# Patient Record
Sex: Female | Born: 1945
Health system: Southern US, Community
[De-identification: ages and names within clinical notes are randomized; demographics above are authoritative.]

## PROBLEM LIST (undated history)

## (undated) DIAGNOSIS — I8390 Asymptomatic varicose veins of unspecified lower extremity: Secondary | ICD-10-CM

## (undated) DIAGNOSIS — M7989 Other specified soft tissue disorders: Secondary | ICD-10-CM

## (undated) DIAGNOSIS — M79606 Pain in leg, unspecified: Secondary | ICD-10-CM

## (undated) HISTORY — DX: Pain in leg, unspecified: M79.606

## (undated) HISTORY — DX: Asymptomatic varicose veins of unspecified lower extremity: I83.90

## (undated) HISTORY — DX: Other specified soft tissue disorders: M79.89

## (undated) HISTORY — PX: CHOLECYSTECTOMY: SHX55

---

## 1993-09-01 HISTORY — PX: ARTHROSCOPY KNEE W/ DRILLING: SUR92

## 1997-10-19 ENCOUNTER — Ambulatory Visit (HOSPITAL_COMMUNITY): Admission: RE | Admit: 1997-10-19 | Discharge: 1997-10-19 | Payer: Self-pay | Admitting: Obstetrics and Gynecology

## 1997-11-22 ENCOUNTER — Other Ambulatory Visit: Admission: RE | Admit: 1997-11-22 | Discharge: 1997-11-22 | Payer: Self-pay | Admitting: Obstetrics and Gynecology

## 1998-11-07 ENCOUNTER — Ambulatory Visit (HOSPITAL_COMMUNITY): Admission: RE | Admit: 1998-11-07 | Discharge: 1998-11-07 | Payer: Self-pay | Admitting: Obstetrics and Gynecology

## 1998-11-07 ENCOUNTER — Encounter: Payer: Self-pay | Admitting: Obstetrics and Gynecology

## 1998-12-06 ENCOUNTER — Other Ambulatory Visit: Admission: RE | Admit: 1998-12-06 | Discharge: 1998-12-06 | Payer: Self-pay | Admitting: Obstetrics and Gynecology

## 1999-11-12 ENCOUNTER — Encounter: Payer: Self-pay | Admitting: Obstetrics and Gynecology

## 1999-11-12 ENCOUNTER — Ambulatory Visit (HOSPITAL_COMMUNITY): Admission: RE | Admit: 1999-11-12 | Discharge: 1999-11-12 | Payer: Self-pay | Admitting: Obstetrics and Gynecology

## 1999-11-19 ENCOUNTER — Encounter: Payer: Self-pay | Admitting: Obstetrics and Gynecology

## 1999-11-19 ENCOUNTER — Ambulatory Visit (HOSPITAL_COMMUNITY): Admission: RE | Admit: 1999-11-19 | Discharge: 1999-11-19 | Payer: Self-pay | Admitting: Obstetrics and Gynecology

## 1999-12-23 ENCOUNTER — Other Ambulatory Visit: Admission: RE | Admit: 1999-12-23 | Discharge: 1999-12-23 | Payer: Self-pay | Admitting: Obstetrics and Gynecology

## 2000-11-13 ENCOUNTER — Ambulatory Visit (HOSPITAL_COMMUNITY): Admission: RE | Admit: 2000-11-13 | Discharge: 2000-11-13 | Payer: Self-pay | Admitting: Obstetrics and Gynecology

## 2000-11-13 ENCOUNTER — Encounter: Payer: Self-pay | Admitting: Obstetrics and Gynecology

## 2000-12-22 ENCOUNTER — Other Ambulatory Visit: Admission: RE | Admit: 2000-12-22 | Discharge: 2000-12-22 | Payer: Self-pay | Admitting: Obstetrics and Gynecology

## 2001-12-20 ENCOUNTER — Encounter: Payer: Self-pay | Admitting: Obstetrics and Gynecology

## 2001-12-20 ENCOUNTER — Ambulatory Visit (HOSPITAL_COMMUNITY): Admission: RE | Admit: 2001-12-20 | Discharge: 2001-12-20 | Payer: Self-pay | Admitting: Obstetrics and Gynecology

## 2002-01-26 ENCOUNTER — Other Ambulatory Visit: Admission: RE | Admit: 2002-01-26 | Discharge: 2002-01-26 | Payer: Self-pay | Admitting: Obstetrics and Gynecology

## 2002-05-27 ENCOUNTER — Ambulatory Visit (HOSPITAL_COMMUNITY): Admission: RE | Admit: 2002-05-27 | Discharge: 2002-05-27 | Payer: Self-pay | Admitting: Obstetrics and Gynecology

## 2002-07-07 ENCOUNTER — Encounter (INDEPENDENT_AMBULATORY_CARE_PROVIDER_SITE_OTHER): Payer: Self-pay

## 2002-07-07 ENCOUNTER — Ambulatory Visit (HOSPITAL_COMMUNITY): Admission: RE | Admit: 2002-07-07 | Discharge: 2002-07-07 | Payer: Self-pay | Admitting: Obstetrics and Gynecology

## 2002-10-28 ENCOUNTER — Encounter (INDEPENDENT_AMBULATORY_CARE_PROVIDER_SITE_OTHER): Payer: Self-pay

## 2002-10-28 ENCOUNTER — Ambulatory Visit (HOSPITAL_COMMUNITY): Admission: RE | Admit: 2002-10-28 | Discharge: 2002-10-28 | Payer: Self-pay | Admitting: Obstetrics and Gynecology

## 2003-03-01 ENCOUNTER — Encounter: Payer: Self-pay | Admitting: Obstetrics and Gynecology

## 2003-03-01 ENCOUNTER — Ambulatory Visit (HOSPITAL_COMMUNITY): Admission: RE | Admit: 2003-03-01 | Discharge: 2003-03-01 | Payer: Self-pay | Admitting: Obstetrics and Gynecology

## 2003-03-22 ENCOUNTER — Other Ambulatory Visit: Admission: RE | Admit: 2003-03-22 | Discharge: 2003-03-22 | Payer: Self-pay | Admitting: Obstetrics and Gynecology

## 2003-10-27 ENCOUNTER — Encounter: Admission: RE | Admit: 2003-10-27 | Discharge: 2003-10-27 | Payer: Self-pay | Admitting: Internal Medicine

## 2003-11-10 ENCOUNTER — Encounter: Admission: RE | Admit: 2003-11-10 | Discharge: 2003-11-10 | Payer: Self-pay | Admitting: Internal Medicine

## 2004-05-21 ENCOUNTER — Other Ambulatory Visit: Admission: RE | Admit: 2004-05-21 | Discharge: 2004-05-21 | Payer: Self-pay | Admitting: Obstetrics and Gynecology

## 2004-10-25 ENCOUNTER — Ambulatory Visit (HOSPITAL_COMMUNITY): Admission: RE | Admit: 2004-10-25 | Discharge: 2004-10-25 | Payer: Self-pay | Admitting: Gastroenterology

## 2004-12-19 ENCOUNTER — Encounter: Admission: RE | Admit: 2004-12-19 | Discharge: 2004-12-19 | Payer: Self-pay | Admitting: Internal Medicine

## 2005-06-12 ENCOUNTER — Ambulatory Visit (HOSPITAL_COMMUNITY): Admission: RE | Admit: 2005-06-12 | Discharge: 2005-06-12 | Payer: Self-pay | Admitting: Obstetrics & Gynecology

## 2009-03-15 ENCOUNTER — Ambulatory Visit (HOSPITAL_COMMUNITY): Admission: RE | Admit: 2009-03-15 | Discharge: 2009-03-15 | Payer: Self-pay | Admitting: Cardiology

## 2009-03-27 ENCOUNTER — Encounter: Admission: RE | Admit: 2009-03-27 | Discharge: 2009-03-27 | Payer: Self-pay | Admitting: Cardiology

## 2009-10-10 ENCOUNTER — Emergency Department (HOSPITAL_COMMUNITY): Admission: EM | Admit: 2009-10-10 | Discharge: 2009-10-10 | Payer: Self-pay | Admitting: Emergency Medicine

## 2009-10-12 ENCOUNTER — Inpatient Hospital Stay (HOSPITAL_COMMUNITY): Admission: EM | Admit: 2009-10-12 | Discharge: 2009-10-18 | Payer: Self-pay | Admitting: Emergency Medicine

## 2009-10-16 ENCOUNTER — Encounter (HOSPITAL_BASED_OUTPATIENT_CLINIC_OR_DEPARTMENT_OTHER): Payer: Self-pay | Admitting: Internal Medicine

## 2009-12-31 ENCOUNTER — Encounter: Admission: RE | Admit: 2009-12-31 | Discharge: 2009-12-31 | Payer: Self-pay | Admitting: Obstetrics and Gynecology

## 2010-09-03 ENCOUNTER — Encounter
Admission: RE | Admit: 2010-09-03 | Discharge: 2010-09-03 | Payer: Self-pay | Source: Home / Self Care | Attending: Interventional Radiology | Admitting: Interventional Radiology

## 2010-09-22 ENCOUNTER — Encounter: Payer: Self-pay | Admitting: Obstetrics & Gynecology

## 2010-11-20 LAB — COMPREHENSIVE METABOLIC PANEL
ALT: 109 U/L — ABNORMAL HIGH (ref 0–35)
ALT: 134 U/L — ABNORMAL HIGH (ref 0–35)
ALT: 153 U/L — ABNORMAL HIGH (ref 0–35)
ALT: 210 U/L — ABNORMAL HIGH (ref 0–35)
AST: 172 U/L — ABNORMAL HIGH (ref 0–37)
Alkaline Phosphatase: 179 U/L — ABNORMAL HIGH (ref 39–117)
BUN: 10 mg/dL (ref 6–23)
BUN: 15 mg/dL (ref 6–23)
BUN: 19 mg/dL (ref 6–23)
BUN: 4 mg/dL — ABNORMAL LOW (ref 6–23)
CO2: 20 mEq/L (ref 19–32)
CO2: 25 mEq/L (ref 19–32)
Calcium: 8.4 mg/dL (ref 8.4–10.5)
Calcium: 8.6 mg/dL (ref 8.4–10.5)
Chloride: 100 mEq/L (ref 96–112)
Chloride: 106 mEq/L (ref 96–112)
Chloride: 108 mEq/L (ref 96–112)
Creatinine, Ser: 0.67 mg/dL (ref 0.4–1.2)
Creatinine, Ser: 0.72 mg/dL (ref 0.4–1.2)
GFR calc Af Amer: >60 mL/min (ref >60)
GFR calc non Af Amer: >60 mL/min (ref >60)
GFR calc non Af Amer: >60 mL/min (ref >60)
GFR calc non Af Amer: >60 mL/min (ref >60)
GFR calc non Af Amer: >60 mL/min (ref >60)
Glucose, Bld: 106 mg/dL — ABNORMAL HIGH (ref 70–99)
Glucose, Bld: 120 mg/dL — ABNORMAL HIGH (ref 70–99)
Glucose, Bld: 70 mg/dL (ref 70–99)
Glucose, Bld: 77 mg/dL (ref 70–99)
Glucose, Bld: 96 mg/dL (ref 70–99)
Potassium: 4 mEq/L (ref 3.5–5.1)
Sodium: 134 mEq/L — ABNORMAL LOW (ref 135–145)
Sodium: 135 mEq/L (ref 135–145)
Sodium: 136 mEq/L (ref 135–145)
Sodium: 139 mEq/L (ref 135–145)
Sodium: 140 mEq/L (ref 135–145)
Total Bilirubin: 1.3 mg/dL — ABNORMAL HIGH (ref 0.3–1.2)
Total Bilirubin: 4.6 mg/dL — ABNORMAL HIGH (ref 0.3–1.2)
Total Bilirubin: 7.5 mg/dL — ABNORMAL HIGH (ref 0.3–1.2)
Total Protein: 5.8 g/dL — ABNORMAL LOW (ref 6.0–8.3)
Total Protein: 5.9 g/dL — ABNORMAL LOW (ref 6.0–8.3)
Total Protein: 6.3 g/dL (ref 6.0–8.3)

## 2010-11-20 LAB — LIPASE, BLOOD
Lipase: 750 U/L — ABNORMAL HIGH (ref 11–59)
Lipase: 80 U/L — ABNORMAL HIGH (ref 11–59)
Lipase: 94 U/L — ABNORMAL HIGH (ref 11–59)

## 2010-11-20 LAB — CBC
HCT: 30.6 % — ABNORMAL LOW (ref 36.0–46.0)
HCT: 31.1 % — ABNORMAL LOW (ref 36.0–46.0)
HCT: 40.3 % (ref 36.0–46.0)
Hemoglobin: 10.7 g/dL — ABNORMAL LOW (ref 12.0–15.0)
MCHC: 34.1 g/dL (ref 30.0–36.0)
MCHC: 34.4 g/dL (ref 30.0–36.0)
MCHC: 34.6 g/dL (ref 30.0–36.0)
MCV: 93 fL (ref 78.0–100.0)
MCV: 93.1 fL (ref 78.0–100.0)
Platelets: 213 10*3/uL (ref 150–400)
Platelets: 246 10*3/uL (ref 150–400)
RBC: 3.28 MIL/uL — ABNORMAL LOW (ref 3.87–5.11)
RBC: 3.35 MIL/uL — ABNORMAL LOW (ref 3.87–5.11)
RBC: 4.36 MIL/uL (ref 3.87–5.11)
RDW: 13.2 % (ref 11.5–15.5)
WBC: 6.9 10*3/uL (ref 4.0–10.5)
WBC: 9 10*3/uL (ref 4.0–10.5)
WBC: 9.2 10*3/uL (ref 4.0–10.5)
WBC: 9.6 10*3/uL (ref 4.0–10.5)

## 2010-11-20 LAB — DIFFERENTIAL
Eosinophils Relative: 0 % (ref 0–5)
Lymphocytes Relative: 17 % (ref 12–46)
Lymphs Abs: 1.6 10*3/uL (ref 0.7–4.0)
Neutro Abs: 6.7 10*3/uL (ref 1.7–7.7)

## 2010-11-20 LAB — AMYLASE: Amylase: 132 U/L — ABNORMAL HIGH (ref 0–105)

## 2010-11-20 LAB — TYPE AND SCREEN
ABO/RH(D): A POS
Antibody Screen: NEGATIVE

## 2010-11-20 LAB — ABO/RH: ABO/RH(D): A POS

## 2011-01-17 NOTE — H&P (Signed)
NAME:  Cindy Vaughn, Cindy Vaughn NO.:  [id number]   MEDICAL RECORD NO.:  [id number]                   PATIENT TYPE:  AMB   LOCATION:  SDC                                  FACILITY:  [facility]   PHYSICIAN:  Juluis Mire, M.D.                DATE OF BIRTH:  Dec 16, 1945   DATE OF ADMISSION:  10/28/2002  DATE OF DISCHARGE:                                HISTORY & PHYSICAL   REASON FOR ADMISSION:  The patient is a 65 year old gravida 2 para 2 married  white female who presents for hysteroscopy with possible diagnostic  laparoscopy.   HISTORY OF PRESENT ILLNESS:  The patient has been evaluated in our practice  for post menopausal bleeding.  Because of this she underwent a saline  infusion ultrasound with finding of an anterior wall polyp.  We initially  proceeded with hysteroscopy evaluation September 2003.  We did have a fundal  perforation during hysteroscopy and were not able to completely evaluate the  intrauterine cavity.  Therefore, she underwent a repeat hysteroscopy on  November 16.  Even at that time, despite no perforation there was a weakness  in the fundus that gradually opened up during hysteroscopy.  We did not  visualize a polyp at that time.  We did do endometrial curettings and  biopsies, all of which were completely negative.  Subsequently the patient  was seen back in our office where a repeat saline infusion ultrasound was  performed and again revealed the continued presence of an anterior wall  uterine polyp.  Because of this the patient now presents for hysteroscopy.  Because of the previous separation of the uterine fundus we are going to  have a laparoscopy available should that occur.   ALLERGIES:  No known drug allergies.   MEDICATIONS:  None.   PAST MEDICAL HISTORY:  Usual childhood diseases without any significant  sequelae.   PREVIOUS SURGICAL HISTORY:  Previous bilateral tubal ligation. As noted  above, two previous hysteroscopies.   OBSTETRICAL HISTORY:  One spontaneous vaginal delivery as well as one  previous cesarean section.   FAMILY HISTORY:  Noncontributory.   SOCIAL HISTORY:  No tobacco or alcohol use.   REVIEW OF SYSTEMS:  Noncontributory.   PHYSICAL EXAMINATION:  VITAL SIGNS:  The patient is afebrile with stable  vital signs.  HEENT:  The patient is normocephalic.  Pupils equal, round, and reactive to  light and accomodation.  Extraocular movements were intact.  Sclerae and  conjunctivae clear, oropharynx clear.  NECK:  Without thyromegaly.  BREASTS:  No discrete masses.  LUNGS:  Clear.  CARDIOVASCULAR:  Regular rhythm and rate.  No murmurs or gallops.  ABDOMEN:  Benign.  No masses, organomegaly, or tenderness.  PELVIC:  Normal external genitalia, vaginal mucosa is clear.  Cervix is  unremarkable.  Uterus is normal size, shape, and contour.  Adnexa free of  masses or tenderness.  Rectovaginal exam is clear.  EXTREMITIES:  Trace edema.  NEUROLOGIC:  Grossly within normal limits.   IMPRESSION:  1. Persistent endometrial polyp.  2. Previous hysteroscopy with uterine perforation.   PLANNED MANAGEMENT:  Proceed with reattempt at hysteroscopy with laparoscopy  made available should perforation occur.  Risks of procedure have been  discussed including the risk of infection; the risk of hemorrhage that could  necessitate transfusion or possible hysterectomy; the risk of injury to  adjacent organs including bowel that could require further exploratory  surgery; the risk of deep venous thrombosis and pulmonary embolus.                                               Juluis Mire, M.D.    JSM/MEDQ  D:  10/28/2002  T:  10/28/2002  Job:  [id number]

## 2011-01-17 NOTE — Op Note (Signed)
NAME:  Cindy Vaughn, Cindy Vaughn                           ACCOUNT NO.:  [id number]   MEDICAL RECORD NO.:  [id number]                   PATIENT TYPE:  AMB   LOCATION:  SDC                                  [facility]ILITY[facility]   PHYSICIAN:  Juluis Mire, M.D.                DATE OF BIRTH:  12-21-1945   DATE OF PROCEDURE:  07/07/2002  DATE OF DISCHARGE:                                 OPERATIVE REPORT   PREOPERATIVE DIAGNOSES:  Postmenopausal bleeding with endometrial polyp.   POSTOPERATIVE DIAGNOSES:  1. No evidence of endometrial polyp.  2. Recurrent fundal separation.   OPERATIVE PROCEDURE:  1. Cervical dilatation.  2. Hysteroscopy.  3. Endometrial biopsy.  4. Endometrial curetting.   SURGEON:  Juluis Mire, M.D.   ANESTHESIA:  General.   ESTIMATED BLOOD LOSS:  Minimal.   PACKS AND DRAINS:  None.   INTRAOPERATIVE BLOOD PLACED:  None.   COMPLICATIONS:  None.   INDICATIONS:  Dictated in history and physical.   PROCEDURE AS FOLLOWS:  The patient was taken to the OR and placed in supine  position.  After a satisfactory level of general anesthesia was obtained,  the patient was placed in the dorsal lithotomy position using the Allen  stirrups.  Perineum and vagina prepped out with Betadine, draped out for  hysteroscopy.  Speculum was placed in the vaginal vault.  The cervix grasped  with a single tooth tenaculum.  Uterus was posterior.  It was serially  dilated to a size 35 Pratt dilator.  A nonoperative hysteroscope was  introduced into the intrauterine cavity, was distended using sorbitol.  Visualization revealed a very smooth endometrium.  There did not appear to  be any polyp or abnormalities.  At the fundus area you could see a very  thinned area.  We subsequently went to the operative hysteroscope.  Again,  we had the thinned area in the fundus, but again, no endometrial polyps or  abnormalities noted.  We did biopsies from the anterior and posterior wall.  We also obtained  some endometrial curettings.  After procedure, except for  the thinning of the uterine fundus, there was no evidence of complications  or problems.  This thin area of the fundus actually was extruding some of  the fluid from the endometrial cavity but we did get good enough distention  to evaluate the uterine cavity.  We had good hemostasis at this point.  There was no evidence of active bleeding.  Hysteroscope was removed along with the single tooth tenaculum and speculum.  The patient was taken out of the dorsal lithotomy position.  Once alert,  transferred to recovery room in good condition.  Sponge, needle, and  instrument count reported as correct by circulating nurse.  Juluis Mire, M.D.    JSM/MEDQ  D:  07/07/2002  T:  07/07/2002  Job:  [id number]

## 2011-01-17 NOTE — H&P (Signed)
NAME:  Cindy Vaughn, Cindy Vaughn NO.:  [id number]   MEDICAL RECORD NO.:  [id number]                   PATIENT TYPE:  AMB   LOCATION:  [facility]C                                  [facility]   PHYSICIAN:  Juluis Mire, M.D.                DATE OF BIRTH:  May 29, 1946   DATE OF ADMISSION:  07/17/2002  DATE OF DISCHARGE:                                HISTORY & PHYSICAL   HISTORY OF PRESENT ILLNESS:  The patient is a 65 year old gravida 2, para 2  postmenopausal white female who presents for hysteroscopic evaluation.   In relation to the present admission patient has been having trouble with  postmenopausal bleeding.  Because of this she underwent a saline infusion  ultrasound with finding or a large posterior wall endometrial polyp and  initially proceeded for hysteroscopic evaluation in September of this year.  Because of uterine perforation during hysteroscopy, we were unable to  completely evaluate the intrauterine cavity.  Therefore, she presents back  today for attempts at hysteroscopic evaluation.   ALLERGIES:  No known drug allergies.   MEDICATIONS:  None.   PAST MEDICAL HISTORY:  Usual childhood diseases without any significant  sequelae.   PAST SURGICAL HISTORY:  Previous bilateral tubal ligation.   OBSTETRICAL HISTORY:  She has had one spontaneous vaginal delivery as well  as a previous cesarean section.   FAMILY HISTORY:  Noncontributory.   SOCIAL HISTORY:  No tobacco or alcohol use.   REVIEW OF SYSTEMS:  Noncontributory.   PHYSICAL EXAMINATION:  VITAL SIGNS:  The patient is afebrile with stable  vital signs.  HEENT:  The patient is normocephalic.  Pupils are equal, round, and reactive  to light and accommodation.  Extraocular movements are intact.  Sclerae and  conjunctivae clear.  Oropharynx clear.  NECK:  Without thyromegaly.  BREASTS:  No discreet masses.  LUNGS:  Clear.  CARDIOVASCULAR:  Regular rhythm and rate without murmurs or  gallops.  ABDOMEN:  Benign.  No mass, organomegaly, or tenderness.  PELVIC:  Normal external genitalia.  Vaginal mucosa clear.  Cervix  unremarkable.  Uterus normal size, shape, and contour.  Adnexa free of mass  or tenderness.  Rectovaginal examination is clear.  EXTREMITIES:  Trace edema.  NEUROLOGIC:  Grossly within normal limits.   IMPRESSION:  Postmenopausal bleeding with polyp.   PLAN:  The patient will undergo attempts at hysteroscopic evaluation.  The  risks of surgery have been discussed including the risk of infection, the  risk of hemorrhage that could necessitate transfusion or hysterectomy, the  risk of uterine perforation that could lead to injury to adjacent organs  requiring laparoscopy and possible exploratory laparotomy, the risk of deep  venous thrombosis and pulmonary embolus.  The patient expressed  understanding of indications and risks.  Juluis Mire, M.D.    JSM/MEDQ  D:  07/07/2002  T:  07/07/2002  Job:  [id number]

## 2011-01-17 NOTE — Op Note (Signed)
NAME:  Cindy Vaughn, Cindy Vaughn                 ACCOUNT NO.:  [id number]   MEDICAL RECORD NO.:  [id number]          PATIENT TYPE:  AMB   LOCATION:  ENDO                         FACILITY:  [facility]   PHYSICIAN:  Anselmo Rod, M.D.  DATE OF BIRTH:  [birth year]   DATE OF PROCEDURE:  10/25/2004  DATE OF DISCHARGE:                                 OPERATIVE REPORT   PROCEDURE PERFORMED:  Screening colonoscopy.   ENDOSCOPIST:  Anselmo Rod, M.D.   INSTRUMENT USED:  Olympus video colonoscope.   INDICATIONS FOR PROCEDURE:  A 65 year old white female with a history of  occasional constipation undergoing a screening colonoscopy to rule out  colonic polyps, masses, etc.   PREPROCEDURE PREPARATION:  Informed consent was procured from the patient.  The patient fasted for eight hours prior to the procedure and prepped with a  bottle of magnesium citrate and a gallon of GoLYTELY the night prior to the  procedure.  Risks and benefits of the procedure including a 10% miss rate of  cancer and polyps was discussed with the patient as well.   PREPROCEDURE PHYSICAL:  VITAL SIGNS:  Stable vital signs.  NECK:  Supple.  CHEST:  Clear to auscultation.  CARDIOVASCULAR:  S1 and S2 regular.  ABDOMEN:  Soft with normal bowel sounds.   DESCRIPTION OF PROCEDURE:  The patient was placed in left lateral decubitus  position, sedated with 75 mg of Demerol and 6 mg of Versed in slow  incremental doses.  Once the patient was adequately sedated and maintained  on low flow oxygen and continuous cardiac monitoring, the Olympus video  colonoscope was advanced from the rectum to the cecum.  The appendiceal  orifice and ileocecal valve were clearly visualized and photographed.  No  masses, polyps, erosions, ulcerations or diverticula were seen.  Retroflexion in the rectum revealed no abnormalities.  There was some stool  in the right colon.  Multiple washings were done.   IMPRESSION:  Normal colonoscopy up to the cecum.  No  masses, polyps or  diverticula seen.   RECOMMENDATIONS:  1.  Continue a high fiber diet with liberal fluid intake.  2.  Repeat colonoscopy in the next five years unless the patient develops      any abnormal symptoms in the interim.      JNM/MEDQ  D:  10/25/2004  T:  10/25/2004  Job:  [id number]   cc:   Juluis Mire, M.D.  [address] [place]  [place]  [place] [address]  Fax: [phone]   Olene Craven, M.D.  [address]  [address]  [place]  [place] [address]  Fax: [phone]

## 2011-01-17 NOTE — Op Note (Signed)
   NAME:  Cindy Vaughn, Cindy Vaughn NO.:  [id number]   MEDICAL RECORD NO.:  [id number]                   PATIENT TYPE:  AMB   LOCATION:  [facility]C                                  [facility]   PHYSICIAN:  Juluis Mire, M.D.                DATE OF BIRTH:  [birth year]   DATE OF PROCEDURE:  10/28/2002  DATE OF DISCHARGE:                                 OPERATIVE REPORT   PREOPERATIVE DIAGNOSES:  Endometrial polyp.   POSTOPERATIVE DIAGNOSES:  Endometrial polyp.   OPERATIVE PROCEDURE:  1. Hysteroscopy with resection of endometrial polyp.  2. Endometrial biopsies.  3. Endometrial curettings.   SURGEON:  Juluis Mire, M.D.   ANESTHESIA:  General endotracheal.   ESTIMATED BLOOD LOSS:  Minimal.   PACKS AND DRAINS:  None.   INTRAOPERATIVE BLOOD PLACED:  None.   COMPLICATIONS:  None.   INDICATIONS:  Dictated in history and physical.   PROCEDURE:  The patient taken to the OR and placed in supine position.  After a satisfactory level of general endotracheal anesthesia was obtained  the patient placed in a dorsal lithotomy position using Allen stirrups.  Abdomen, perineum, vagina prepped out with Betadine.  The patient draped out  for hysteroscopy.  Examination under anesthesia revealed the uterus to be  mid to posterior in position, normal size and shape.  Adnexa unremarkable.  Bladder was emptied by in-and-out catheterization.  A speculum was placed in  the vaginal vault.  The cervix was grasped with a single tooth tenaculum.  Uterus sounded to approximately 8 cm.  The cervix was serially dilated to  size 35 Pratt dilator.  Operative hysteroscope was introduced.  Intrauterine  cavity was distended using sorbitol.  A definite endometrial polyp was noted  on the right posterior wall.  This was resected in two large biopsies and  sent for pathologic review.  Subsequently, multiple endometrial biopsies  were obtained along with endometrial curettings.  There is no  signs of  perforation or other complications.  Polyp was inadequately resected.  Hysteroscope was removed along with the single tooth tenaculum.  She had no  active vaginal bleeding.  The  speculum was then removed.  The patient taken out of the dorsal lithotomy  position, once alert and extubated transferred to recovery room in good  condition.  Sponge, instrument, needle count reported as correct by  circulating nurse x1.                                               Juluis Mire, M.D.    JSM/MEDQ  D:  10/28/2002  T:  10/28/2002  Job:  [id number]

## 2011-01-17 NOTE — Op Note (Signed)
NAME:  Cindy Vaughn, Cindy Vaughn NO.:  [id number]   MEDICAL RECORD NO.:  [id number]                   PATIENT TYPE:  AMB   LOCATION:  SDC                                  [facility]   PHYSICIAN:  Juluis Mire, M.D.                DATE OF BIRTH:  Mar 14, 1946   DATE OF PROCEDURE:  05/27/2002  DATE OF DISCHARGE:                                 OPERATIVE REPORT   PREOPERATIVE DIAGNOSES:  Postmenopausal bleeding with endometrial polyp.   POSTOPERATIVE DIAGNOSES:  1. Postmenopausal bleeding with endometrial polyp.  2. Fundal perforation.   OPERATIVE PROCEDURE:  1. Paracervical block.  2. Hysteroscopy with finding of fundal perforation and discontinuing of     procedure.   ANESTHESIA:  Sedation with paracervical block.   ESTIMATED BLOOD LOSS:  Minimal.   PACKS AND DRAINS:  None.   INTRAOPERATIVE BLOOD PLACED:  None.   COMPLICATIONS:  Fundal perforation.   PROCEDURE:  The patient was taken to the OR and placed in supine position.  After sedation, was placed in a paracervical block using the Allen stirrups.  At this point in time patient draped out for hysteroscopy.  A speculum was  placed in the vaginal vault.  The cervix and vagina were cleansed with  dilute solution of Betadine and water.  Paracervical block was instituted  using 1% Xylocaine.  The cervix was grasped with a single tooth tenaculum.  During uterine sound, it was felt that we did have a fundal perforation.  We  felt like we were able to redirect the dilators posteriorly and we dilated  to a size 33 Pratt dilator.  Hysteroscope was introduced.  We could  visualize the fundal perforation.  There was no evidence of any bowel in the  area.  This was in the anterior fundal area.  However, we could not distend  the uterine cavity enough to visualize the polyp for removal.  At this point  in time due to the perforation we ended up with 1000 cc of deficit which we  felt was in the  intra-abdominal cavity but we had to discontinue the  procedure due to the deficit, the fact that I could not distend the uterus  to visualize the polyp.  There were no signs of active bleeding or  hemorrhage.  The hysteroscope was removed along with the single-tooth  tenaculum.  The patient was observed and again, no active bleeding was  noted.  The patient seemed stable.  Once alert, she was transferred to  recovery room in good condition.  Sponge, instrument, and needle count  reported as correct by circulating nurse.  Juluis Mire, M.D.    JSM/MEDQ  D:  05/27/2002  T:  05/27/2002  Job:  [id number]

## 2011-01-17 NOTE — H&P (Signed)
   NAME:  Cindy Vaughn, Cindy Vaughn NO.:  [id number]   MEDICAL RECORD NO.:  [id number]                   PATIENT TYPE:  AMB   LOCATION:  SDC                                  [facility]   PHYSICIAN:  Juluis Mire, M.D.                DATE OF BIRTH:  1945/10/03   DATE OF ADMISSION:  05/27/2002  DATE OF DISCHARGE:                                HISTORY & PHYSICAL   HISTORY:  A 65 year old gravida 3, para 2, postmenopausal white female who  presents for hysteroscopy. In relation to the present admission, the patient  had problems with postmenopausal bleeding and underwent saline effusion  ultrasound and found an endometrial polyp.  Now presents for hysteroscopic  resection.   ALLERGIES:  No known drug allergies.   MEDICATIONS:  None.   PAST MEDICAL HISTORY:  Usual childhood diseases without any significant  sequelae.   SURGICAL HISTORY:  Previous tubal.  In terms of obstetrical history, had a  vaginal delivery as well as a previous cesarean section.   FAMILY HISTORY:  Noncontributory.   SOCIAL HISTORY:  No tobacco or alcohol use.   REVIEW OF SYSTEMS:  Noncontributory.   PHYSICAL EXAMINATION:  The patient is afebrile. Stable vital signs.  HEENT:  Patient normocephalic. Pupils equal, round and reactive to light and  accommodation.  Extraocular movements intact.  Sclerae and conjunctiva  clear.  Oropharynx clear.  Neck without thyromegaly.  BREASTS:  No discrete masses.  LUNGS:  Clear.  CARDIOVASCULAR SYSTEM:  Regular rate and rhythm without murmurs or gallops.  ABDOMINAL EXAM:  Benign.  No masses, organomegaly or tenderness.  PELVIC:  Normal external genitalia.  Vagina mucosa is clear.  Cervix  unremarkable.  Uterus normal size, sharp and contour.  Adnexa free of masses  or tenderness.  RECTOVAGINAL EXAM:  Clear.  EXTREMITIES:  Trace edema.  NEUROLOGIC EXAM:  Grossly normal.   IMPRESSION:  Postmenopausal bleeding with endometrial polyp.   PLAN:   The patient to undergo a hysteroscopic evaluation resection.  The  risks of surgery have been discussed including the risk of anesthesia, the  risk of infection, the risk of hemorrhage that could necessitate transfusion  and  subsequent hysterectomy, risk of injury to adjacent organs and perforation  that could require laparoscopy and possible exploratory laparotomy, risk of  deep venous thrombosis and pulmonary embolus.  The patient expressed  understanding, indications and risk.                                               Juluis Mire, M.D.    JSM/MEDQ  D:  05/27/2002  T:  05/27/2002  Job:  [id number]

## 2011-09-24 ENCOUNTER — Other Ambulatory Visit: Payer: Self-pay | Admitting: Dermatology

## 2011-09-24 DIAGNOSIS — Z411 Encounter for cosmetic surgery: Secondary | ICD-10-CM | POA: Diagnosis not present

## 2011-09-24 DIAGNOSIS — D239 Other benign neoplasm of skin, unspecified: Secondary | ICD-10-CM | POA: Diagnosis not present

## 2011-09-24 DIAGNOSIS — L219 Seborrheic dermatitis, unspecified: Secondary | ICD-10-CM | POA: Diagnosis not present

## 2011-09-24 DIAGNOSIS — L821 Other seborrheic keratosis: Secondary | ICD-10-CM | POA: Diagnosis not present

## 2011-09-24 DIAGNOSIS — D235 Other benign neoplasm of skin of trunk: Secondary | ICD-10-CM | POA: Diagnosis not present

## 2011-09-24 DIAGNOSIS — L538 Other specified erythematous conditions: Secondary | ICD-10-CM | POA: Diagnosis not present

## 2011-12-29 DIAGNOSIS — Z1231 Encounter for screening mammogram for malignant neoplasm of breast: Secondary | ICD-10-CM | POA: Diagnosis not present

## 2011-12-29 DIAGNOSIS — Z124 Encounter for screening for malignant neoplasm of cervix: Secondary | ICD-10-CM | POA: Diagnosis not present

## 2012-01-06 DIAGNOSIS — M25569 Pain in unspecified knee: Secondary | ICD-10-CM | POA: Diagnosis not present

## 2012-01-07 ENCOUNTER — Ambulatory Visit: Payer: Medicare Other | Attending: Family Medicine | Admitting: Physical Therapy

## 2012-01-07 DIAGNOSIS — IMO0001 Reserved for inherently not codable concepts without codable children: Secondary | ICD-10-CM | POA: Diagnosis not present

## 2012-01-07 DIAGNOSIS — M25569 Pain in unspecified knee: Secondary | ICD-10-CM | POA: Insufficient documentation

## 2012-01-07 DIAGNOSIS — M25669 Stiffness of unspecified knee, not elsewhere classified: Secondary | ICD-10-CM | POA: Diagnosis not present

## 2012-01-07 DIAGNOSIS — M6281 Muscle weakness (generalized): Secondary | ICD-10-CM | POA: Diagnosis not present

## 2012-01-07 DIAGNOSIS — R262 Difficulty in walking, not elsewhere classified: Secondary | ICD-10-CM | POA: Insufficient documentation

## 2012-01-09 ENCOUNTER — Ambulatory Visit: Payer: Medicare Other | Admitting: Physical Therapy

## 2012-01-09 DIAGNOSIS — M6281 Muscle weakness (generalized): Secondary | ICD-10-CM | POA: Diagnosis not present

## 2012-01-09 DIAGNOSIS — M25669 Stiffness of unspecified knee, not elsewhere classified: Secondary | ICD-10-CM | POA: Diagnosis not present

## 2012-01-09 DIAGNOSIS — M25569 Pain in unspecified knee: Secondary | ICD-10-CM | POA: Diagnosis not present

## 2012-01-09 DIAGNOSIS — IMO0001 Reserved for inherently not codable concepts without codable children: Secondary | ICD-10-CM | POA: Diagnosis not present

## 2012-01-09 DIAGNOSIS — R262 Difficulty in walking, not elsewhere classified: Secondary | ICD-10-CM | POA: Diagnosis not present

## 2012-01-14 ENCOUNTER — Ambulatory Visit: Payer: Medicare Other | Admitting: Physical Therapy

## 2012-01-14 DIAGNOSIS — M25569 Pain in unspecified knee: Secondary | ICD-10-CM | POA: Diagnosis not present

## 2012-01-14 DIAGNOSIS — M6281 Muscle weakness (generalized): Secondary | ICD-10-CM | POA: Diagnosis not present

## 2012-01-14 DIAGNOSIS — IMO0001 Reserved for inherently not codable concepts without codable children: Secondary | ICD-10-CM | POA: Diagnosis not present

## 2012-01-14 DIAGNOSIS — R262 Difficulty in walking, not elsewhere classified: Secondary | ICD-10-CM | POA: Diagnosis not present

## 2012-01-14 DIAGNOSIS — M25669 Stiffness of unspecified knee, not elsewhere classified: Secondary | ICD-10-CM | POA: Diagnosis not present

## 2012-01-15 ENCOUNTER — Ambulatory Visit: Payer: Medicare Other | Admitting: Physical Therapy

## 2012-01-15 DIAGNOSIS — R262 Difficulty in walking, not elsewhere classified: Secondary | ICD-10-CM | POA: Diagnosis not present

## 2012-01-15 DIAGNOSIS — M25669 Stiffness of unspecified knee, not elsewhere classified: Secondary | ICD-10-CM | POA: Diagnosis not present

## 2012-01-15 DIAGNOSIS — IMO0001 Reserved for inherently not codable concepts without codable children: Secondary | ICD-10-CM | POA: Diagnosis not present

## 2012-01-15 DIAGNOSIS — M6281 Muscle weakness (generalized): Secondary | ICD-10-CM | POA: Diagnosis not present

## 2012-01-15 DIAGNOSIS — M25569 Pain in unspecified knee: Secondary | ICD-10-CM | POA: Diagnosis not present

## 2012-01-20 ENCOUNTER — Encounter: Payer: Medicare Other | Admitting: Physical Therapy

## 2012-01-22 ENCOUNTER — Encounter: Payer: Medicare Other | Admitting: Physical Therapy

## 2012-01-23 ENCOUNTER — Encounter: Payer: Medicare Other | Admitting: Physical Therapy

## 2012-01-27 ENCOUNTER — Encounter: Payer: Medicare Other | Admitting: Physical Therapy

## 2012-01-29 ENCOUNTER — Encounter: Payer: Medicare Other | Admitting: Physical Therapy

## 2012-02-23 DIAGNOSIS — M999 Biomechanical lesion, unspecified: Secondary | ICD-10-CM | POA: Diagnosis not present

## 2012-02-27 DIAGNOSIS — M999 Biomechanical lesion, unspecified: Secondary | ICD-10-CM | POA: Diagnosis not present

## 2012-03-01 DIAGNOSIS — M999 Biomechanical lesion, unspecified: Secondary | ICD-10-CM | POA: Diagnosis not present

## 2012-03-03 DIAGNOSIS — M999 Biomechanical lesion, unspecified: Secondary | ICD-10-CM | POA: Diagnosis not present

## 2012-03-05 DIAGNOSIS — H524 Presbyopia: Secondary | ICD-10-CM | POA: Diagnosis not present

## 2012-03-05 DIAGNOSIS — H04129 Dry eye syndrome of unspecified lacrimal gland: Secondary | ICD-10-CM | POA: Diagnosis not present

## 2012-03-08 DIAGNOSIS — M999 Biomechanical lesion, unspecified: Secondary | ICD-10-CM | POA: Diagnosis not present

## 2012-03-10 DIAGNOSIS — Z136 Encounter for screening for cardiovascular disorders: Secondary | ICD-10-CM | POA: Diagnosis not present

## 2012-03-10 DIAGNOSIS — Z0000 Encounter for general adult medical examination without abnormal findings: Secondary | ICD-10-CM | POA: Diagnosis not present

## 2012-03-10 DIAGNOSIS — M25569 Pain in unspecified knee: Secondary | ICD-10-CM | POA: Diagnosis not present

## 2012-03-10 DIAGNOSIS — Z1331 Encounter for screening for depression: Secondary | ICD-10-CM | POA: Diagnosis not present

## 2012-03-10 DIAGNOSIS — Z23 Encounter for immunization: Secondary | ICD-10-CM | POA: Diagnosis not present

## 2012-03-15 DIAGNOSIS — M999 Biomechanical lesion, unspecified: Secondary | ICD-10-CM | POA: Diagnosis not present

## 2012-03-19 DIAGNOSIS — M1710 Unilateral primary osteoarthritis, unspecified knee: Secondary | ICD-10-CM | POA: Diagnosis not present

## 2012-03-22 DIAGNOSIS — M999 Biomechanical lesion, unspecified: Secondary | ICD-10-CM | POA: Diagnosis not present

## 2012-07-16 DIAGNOSIS — M1710 Unilateral primary osteoarthritis, unspecified knee: Secondary | ICD-10-CM | POA: Diagnosis not present

## 2012-09-22 ENCOUNTER — Other Ambulatory Visit: Payer: Self-pay | Admitting: Dermatology

## 2012-09-22 DIAGNOSIS — L821 Other seborrheic keratosis: Secondary | ICD-10-CM | POA: Diagnosis not present

## 2012-09-22 DIAGNOSIS — D485 Neoplasm of uncertain behavior of skin: Secondary | ICD-10-CM | POA: Diagnosis not present

## 2012-09-22 DIAGNOSIS — L608 Other nail disorders: Secondary | ICD-10-CM | POA: Diagnosis not present

## 2012-09-22 DIAGNOSIS — D239 Other benign neoplasm of skin, unspecified: Secondary | ICD-10-CM | POA: Diagnosis not present

## 2012-11-19 DIAGNOSIS — I872 Venous insufficiency (chronic) (peripheral): Secondary | ICD-10-CM | POA: Diagnosis not present

## 2012-11-19 DIAGNOSIS — M79609 Pain in unspecified limb: Secondary | ICD-10-CM | POA: Diagnosis not present

## 2012-11-30 DIAGNOSIS — I8310 Varicose veins of unspecified lower extremity with inflammation: Secondary | ICD-10-CM | POA: Diagnosis not present

## 2012-12-01 DIAGNOSIS — M79609 Pain in unspecified limb: Secondary | ICD-10-CM | POA: Diagnosis not present

## 2012-12-01 DIAGNOSIS — I8310 Varicose veins of unspecified lower extremity with inflammation: Secondary | ICD-10-CM | POA: Diagnosis not present

## 2012-12-02 DIAGNOSIS — I8310 Varicose veins of unspecified lower extremity with inflammation: Secondary | ICD-10-CM | POA: Diagnosis not present

## 2012-12-02 DIAGNOSIS — M79609 Pain in unspecified limb: Secondary | ICD-10-CM | POA: Diagnosis not present

## 2012-12-20 DIAGNOSIS — I8310 Varicose veins of unspecified lower extremity with inflammation: Secondary | ICD-10-CM | POA: Diagnosis not present

## 2012-12-20 HISTORY — PX: ENDOVENOUS ABLATION SAPHENOUS VEIN W/ LASER: SUR449

## 2012-12-21 DIAGNOSIS — M9981 Other biomechanical lesions of cervical region: Secondary | ICD-10-CM | POA: Diagnosis not present

## 2012-12-21 DIAGNOSIS — M999 Biomechanical lesion, unspecified: Secondary | ICD-10-CM | POA: Diagnosis not present

## 2012-12-22 DIAGNOSIS — M999 Biomechanical lesion, unspecified: Secondary | ICD-10-CM | POA: Diagnosis not present

## 2012-12-22 DIAGNOSIS — I8310 Varicose veins of unspecified lower extremity with inflammation: Secondary | ICD-10-CM | POA: Diagnosis not present

## 2012-12-22 DIAGNOSIS — M9981 Other biomechanical lesions of cervical region: Secondary | ICD-10-CM | POA: Diagnosis not present

## 2012-12-22 DIAGNOSIS — M79609 Pain in unspecified limb: Secondary | ICD-10-CM | POA: Diagnosis not present

## 2012-12-28 DIAGNOSIS — M9981 Other biomechanical lesions of cervical region: Secondary | ICD-10-CM | POA: Diagnosis not present

## 2012-12-28 DIAGNOSIS — M999 Biomechanical lesion, unspecified: Secondary | ICD-10-CM | POA: Diagnosis not present

## 2012-12-29 DIAGNOSIS — I8310 Varicose veins of unspecified lower extremity with inflammation: Secondary | ICD-10-CM | POA: Diagnosis not present

## 2012-12-31 DIAGNOSIS — I8310 Varicose veins of unspecified lower extremity with inflammation: Secondary | ICD-10-CM | POA: Diagnosis not present

## 2012-12-31 DIAGNOSIS — M79609 Pain in unspecified limb: Secondary | ICD-10-CM | POA: Diagnosis not present

## 2012-12-31 DIAGNOSIS — M999 Biomechanical lesion, unspecified: Secondary | ICD-10-CM | POA: Diagnosis not present

## 2012-12-31 DIAGNOSIS — M9981 Other biomechanical lesions of cervical region: Secondary | ICD-10-CM | POA: Diagnosis not present

## 2013-01-18 DIAGNOSIS — M79609 Pain in unspecified limb: Secondary | ICD-10-CM | POA: Diagnosis not present

## 2013-01-18 DIAGNOSIS — I8310 Varicose veins of unspecified lower extremity with inflammation: Secondary | ICD-10-CM | POA: Diagnosis not present

## 2013-01-21 DIAGNOSIS — Z124 Encounter for screening for malignant neoplasm of cervix: Secondary | ICD-10-CM | POA: Diagnosis not present

## 2013-01-21 DIAGNOSIS — Z1231 Encounter for screening mammogram for malignant neoplasm of breast: Secondary | ICD-10-CM | POA: Diagnosis not present

## 2013-02-07 DIAGNOSIS — I8310 Varicose veins of unspecified lower extremity with inflammation: Secondary | ICD-10-CM | POA: Diagnosis not present

## 2013-02-07 DIAGNOSIS — M79609 Pain in unspecified limb: Secondary | ICD-10-CM | POA: Diagnosis not present

## 2013-02-07 DIAGNOSIS — M7981 Nontraumatic hematoma of soft tissue: Secondary | ICD-10-CM | POA: Diagnosis not present

## 2013-02-23 DIAGNOSIS — M7981 Nontraumatic hematoma of soft tissue: Secondary | ICD-10-CM | POA: Diagnosis not present

## 2013-03-07 DIAGNOSIS — M5137 Other intervertebral disc degeneration, lumbosacral region: Secondary | ICD-10-CM | POA: Diagnosis not present

## 2013-03-07 DIAGNOSIS — M999 Biomechanical lesion, unspecified: Secondary | ICD-10-CM | POA: Diagnosis not present

## 2013-03-09 DIAGNOSIS — M5137 Other intervertebral disc degeneration, lumbosacral region: Secondary | ICD-10-CM | POA: Diagnosis not present

## 2013-03-09 DIAGNOSIS — M999 Biomechanical lesion, unspecified: Secondary | ICD-10-CM | POA: Diagnosis not present

## 2013-03-14 DIAGNOSIS — M5137 Other intervertebral disc degeneration, lumbosacral region: Secondary | ICD-10-CM | POA: Diagnosis not present

## 2013-03-14 DIAGNOSIS — M999 Biomechanical lesion, unspecified: Secondary | ICD-10-CM | POA: Diagnosis not present

## 2013-03-18 DIAGNOSIS — M999 Biomechanical lesion, unspecified: Secondary | ICD-10-CM | POA: Diagnosis not present

## 2013-03-18 DIAGNOSIS — M5137 Other intervertebral disc degeneration, lumbosacral region: Secondary | ICD-10-CM | POA: Diagnosis not present

## 2013-03-21 DIAGNOSIS — M999 Biomechanical lesion, unspecified: Secondary | ICD-10-CM | POA: Diagnosis not present

## 2013-03-21 DIAGNOSIS — M5137 Other intervertebral disc degeneration, lumbosacral region: Secondary | ICD-10-CM | POA: Diagnosis not present

## 2013-03-23 DIAGNOSIS — I8310 Varicose veins of unspecified lower extremity with inflammation: Secondary | ICD-10-CM | POA: Diagnosis not present

## 2013-03-23 DIAGNOSIS — M7989 Other specified soft tissue disorders: Secondary | ICD-10-CM | POA: Diagnosis not present

## 2013-03-23 HISTORY — PX: ENDOVENOUS ABLATION SAPHENOUS VEIN W/ LASER: SUR449

## 2013-03-25 DIAGNOSIS — M999 Biomechanical lesion, unspecified: Secondary | ICD-10-CM | POA: Diagnosis not present

## 2013-03-25 DIAGNOSIS — M5137 Other intervertebral disc degeneration, lumbosacral region: Secondary | ICD-10-CM | POA: Diagnosis not present

## 2013-03-28 DIAGNOSIS — M999 Biomechanical lesion, unspecified: Secondary | ICD-10-CM | POA: Diagnosis not present

## 2013-03-28 DIAGNOSIS — M5137 Other intervertebral disc degeneration, lumbosacral region: Secondary | ICD-10-CM | POA: Diagnosis not present

## 2013-04-01 DIAGNOSIS — M5137 Other intervertebral disc degeneration, lumbosacral region: Secondary | ICD-10-CM | POA: Diagnosis not present

## 2013-04-01 DIAGNOSIS — M999 Biomechanical lesion, unspecified: Secondary | ICD-10-CM | POA: Diagnosis not present

## 2013-04-04 DIAGNOSIS — M5137 Other intervertebral disc degeneration, lumbosacral region: Secondary | ICD-10-CM | POA: Diagnosis not present

## 2013-04-04 DIAGNOSIS — M999 Biomechanical lesion, unspecified: Secondary | ICD-10-CM | POA: Diagnosis not present

## 2013-04-11 DIAGNOSIS — M999 Biomechanical lesion, unspecified: Secondary | ICD-10-CM | POA: Diagnosis not present

## 2013-04-11 DIAGNOSIS — M5137 Other intervertebral disc degeneration, lumbosacral region: Secondary | ICD-10-CM | POA: Diagnosis not present

## 2013-04-20 DIAGNOSIS — M5137 Other intervertebral disc degeneration, lumbosacral region: Secondary | ICD-10-CM | POA: Diagnosis not present

## 2013-04-20 DIAGNOSIS — M999 Biomechanical lesion, unspecified: Secondary | ICD-10-CM | POA: Diagnosis not present

## 2013-04-21 DIAGNOSIS — M79609 Pain in unspecified limb: Secondary | ICD-10-CM | POA: Diagnosis not present

## 2013-04-21 DIAGNOSIS — M7981 Nontraumatic hematoma of soft tissue: Secondary | ICD-10-CM | POA: Diagnosis not present

## 2013-04-21 DIAGNOSIS — I8310 Varicose veins of unspecified lower extremity with inflammation: Secondary | ICD-10-CM | POA: Diagnosis not present

## 2013-04-27 DIAGNOSIS — M999 Biomechanical lesion, unspecified: Secondary | ICD-10-CM | POA: Diagnosis not present

## 2013-04-27 DIAGNOSIS — M5137 Other intervertebral disc degeneration, lumbosacral region: Secondary | ICD-10-CM | POA: Diagnosis not present

## 2013-05-04 DIAGNOSIS — M999 Biomechanical lesion, unspecified: Secondary | ICD-10-CM | POA: Diagnosis not present

## 2013-05-04 DIAGNOSIS — M5137 Other intervertebral disc degeneration, lumbosacral region: Secondary | ICD-10-CM | POA: Diagnosis not present

## 2013-05-06 DIAGNOSIS — M999 Biomechanical lesion, unspecified: Secondary | ICD-10-CM | POA: Diagnosis not present

## 2013-05-06 DIAGNOSIS — M5137 Other intervertebral disc degeneration, lumbosacral region: Secondary | ICD-10-CM | POA: Diagnosis not present

## 2013-05-16 DIAGNOSIS — M79609 Pain in unspecified limb: Secondary | ICD-10-CM | POA: Diagnosis not present

## 2013-05-16 DIAGNOSIS — M7981 Nontraumatic hematoma of soft tissue: Secondary | ICD-10-CM | POA: Diagnosis not present

## 2013-05-16 DIAGNOSIS — I8310 Varicose veins of unspecified lower extremity with inflammation: Secondary | ICD-10-CM | POA: Diagnosis not present

## 2013-06-27 DIAGNOSIS — I8310 Varicose veins of unspecified lower extremity with inflammation: Secondary | ICD-10-CM | POA: Diagnosis not present

## 2013-06-27 DIAGNOSIS — M79609 Pain in unspecified limb: Secondary | ICD-10-CM | POA: Diagnosis not present

## 2013-06-28 DIAGNOSIS — D1779 Benign lipomatous neoplasm of other sites: Secondary | ICD-10-CM | POA: Diagnosis not present

## 2013-06-28 DIAGNOSIS — M722 Plantar fascial fibromatosis: Secondary | ICD-10-CM | POA: Diagnosis not present

## 2013-07-25 DIAGNOSIS — M79609 Pain in unspecified limb: Secondary | ICD-10-CM | POA: Diagnosis not present

## 2013-07-25 DIAGNOSIS — I8310 Varicose veins of unspecified lower extremity with inflammation: Secondary | ICD-10-CM | POA: Diagnosis not present

## 2013-08-04 DIAGNOSIS — H04129 Dry eye syndrome of unspecified lacrimal gland: Secondary | ICD-10-CM | POA: Diagnosis not present

## 2013-08-04 DIAGNOSIS — Z961 Presence of intraocular lens: Secondary | ICD-10-CM | POA: Diagnosis not present

## 2013-08-04 DIAGNOSIS — Z0100 Encounter for examination of eyes and vision without abnormal findings: Secondary | ICD-10-CM | POA: Diagnosis not present

## 2013-08-08 ENCOUNTER — Encounter: Payer: Self-pay | Admitting: Vascular Surgery

## 2013-08-09 ENCOUNTER — Encounter: Payer: Self-pay | Admitting: Vascular Surgery

## 2013-08-09 ENCOUNTER — Ambulatory Visit (INDEPENDENT_AMBULATORY_CARE_PROVIDER_SITE_OTHER): Payer: Medicare Other | Admitting: Vascular Surgery

## 2013-08-09 VITALS — BP 100/70 | HR 73 | Resp 18 | Ht 65.0 in | Wt 183.0 lb

## 2013-08-09 DIAGNOSIS — I83893 Varicose veins of bilateral lower extremities with other complications: Secondary | ICD-10-CM | POA: Diagnosis not present

## 2013-08-09 DIAGNOSIS — M25569 Pain in unspecified knee: Secondary | ICD-10-CM | POA: Insufficient documentation

## 2013-08-09 DIAGNOSIS — M25561 Pain in right knee: Secondary | ICD-10-CM

## 2013-08-09 NOTE — Progress Notes (Signed)
Patient name: Cindy Vaughn MRN: [id number] DOB: [birth year] Sex: female     Reason for referral:  Chief Complaint  Patient presents with  . New Evaluation    treated at [facility]  12-20-2012 laser thermal ablation Right SSV and EVLA is right GSV 03-23-2013,      c/o right leg pain and swelling and left leg swelling  (R>L)        . Varicose Veins    HISTORY OF PRESENT ILLNESS: The patient is a 67 year old white female seen today for discussion of venous pathology. She has had a prior treatment of both her great and small saphenous vein at a outlying vein center. This was in April 2014 in July 2014 respectively. She had presented initially with total leg pain on the right with prolonged standing. Less with walking and sitting. She had recommendation for laser ablation and also had subsequent treatment with sclerotherapy 2 other tributary veins. She's had no relief in her pain symptoms and is seen today for further discussion. She did have a recent duplex evaluation at the outlying vein center and I have the prior records and this most recent ultrasound for evaluation as well. I have evaluated her prior ultrasounds and treatment records from this outlying center. She describes the pain as total right leg. She can have some swelling as well. This is quite intense at the end of the day. She does have some history of low back discomfort treated with chiropractic but no radiation into her leg she has no history of venous ulcers  Past Medical History  Diagnosis Date  . Varicose veins   . Leg pain   . Leg swelling     Past Surgical History  Procedure Laterality Date  . Cesarean section  1986  . Cholecystectomy    . Arthroscopy knee w/ drilling Right 1995  . Endovenous ablation saphenous vein w/ laser Right 12-20-2012    right Small saphenous vein at [facility]  . Endovenous ablation saphenous vein w/ laser Right 03-23-2013    right greater saphenous vein at [facility]     History   Social History  . Marital Status: Widowed    Spouse Name: N/A    Number of Children: N/A  . Years of Education: N/A   Occupational History  . Not on file.   Social History Main Topics  . Smoking status: Former Smoker    Quit date: 09/01/1969  . Smokeless tobacco: Not on file  . Alcohol Use: No  . Drug Use: No  . Sexual Activity: Not on file   Other Topics Concern  . Not on file   Social History Narrative  . No narrative on file    Family History  Problem Relation Age of Onset  . Heart disease Mother   . Cancer Father   . Varicose Veins Father   . Varicose Veins Sister     Allergies as of 08/09/2013  . (No Known Allergies)    No current outpatient prescriptions on file prior to visit.   No current facility-administered medications on file prior to visit.     REVIEW OF SYSTEMS:  Positives indicated with an "X"  CARDIOVASCULAR:  [ ]  chest pain   [ ]  chest pressure   [ ]  palpitations   [ ]  orthopnea   [x ] dyspnea on exertion   [ ]  claudication   [ ]  rest pain   [ ]  DVT   [ ]  phlebitis PULMONARY:   [ ]   productive cough   [ ]  asthma   [ ]  wheezing NEUROLOGIC:   [ ]  weakness  [ ]  paresthesias  [ ]  aphasia  [ ]  amaurosis  [ ]  dizziness HEMATOLOGIC:   [ ]  bleeding problems   [ ]  clotting disorders MUSCULOSKELETAL:  [ ]  joint pain   [ ]  joint swelling GASTROINTESTINAL: [ ]   blood in stool  [ ]   hematemesis GENITOURINARY:  [ ]   dysuria  [ ]   hematuria PSYCHIATRIC:  [ ]  history of major depression INTEGUMENTARY:  [ ]  rashes  [ ]  ulcers CONSTITUTIONAL:  [ ]  fever   [ ]  chills  PHYSICAL EXAMINATION:  General: The patient is a well-nourished female, in no acute distress. Vital signs are BP 100/70  Pulse 73  Resp 18  Ht 5\' 5"  (1.651 m)  Wt 183 lb (83.008 kg)  BMI 30.45 kg/m2 Pulmonary: There is a good air exchange bilaterally   Musculoskeletal: There are no major deformities.   Neurologic: No focal weakness or paresthesias are detected, Skin:  There are no ulcer or rashes noted. Psychiatric: The patient has normal affect. Cardiovascular: There is a regular rate and rhythm  Pulse status: 2+ dorsalis pedis pulses bilaterally   Vascular Lab Studies:  Reviewed from outlying vein center from November 2014 revealing closure of her greater small saphenous vein  I reimaged these areas with the SonoSite ultrasound myself in this does show closure.  Impression and Plan:  I had a long discussion with the patient. I explained the treatment that had been recommended was successful in closing her greater small saphenous vein. I clearly she's had no symptoms related in therefore it is unlikely that venous hypertension was the cause of her leg pain. I explained that there is nothing further to offer from a vascular standpoint. She is frustrated that of having to reduce travel and activities related to leg pain. It does not sound like typical discogenic back pain radiating into her right leg but this certainly is possible. She will discuss this with her medical Dr. determine if further nonvascular etiology of her discomfort is warranted. She will see [place] on an as-needed basis    Terrica Duecker [facility] Office: [phone]

## 2013-08-10 ENCOUNTER — Encounter: Payer: Self-pay | Admitting: Family Medicine

## 2013-08-26 DIAGNOSIS — K118 Other diseases of salivary glands: Secondary | ICD-10-CM | POA: Diagnosis not present

## 2013-11-03 DIAGNOSIS — M7989 Other specified soft tissue disorders: Secondary | ICD-10-CM | POA: Diagnosis not present

## 2013-11-03 DIAGNOSIS — I8310 Varicose veins of unspecified lower extremity with inflammation: Secondary | ICD-10-CM | POA: Diagnosis not present

## 2013-12-03 ENCOUNTER — Emergency Department (HOSPITAL_COMMUNITY)
Admission: EM | Admit: 2013-12-03 | Discharge: 2013-12-03 | Disposition: A | Discharge status: Home / Self Care | Payer: Medicare Other | Attending: Emergency Medicine | Admitting: Emergency Medicine

## 2013-12-03 ENCOUNTER — Encounter (HOSPITAL_COMMUNITY): Payer: Self-pay | Admitting: Emergency Medicine

## 2013-12-03 DIAGNOSIS — Z8679 Personal history of other diseases of the circulatory system: Secondary | ICD-10-CM | POA: Insufficient documentation

## 2013-12-03 DIAGNOSIS — W269XXA Contact with unspecified sharp object(s), initial encounter: Secondary | ICD-10-CM | POA: Insufficient documentation

## 2013-12-03 DIAGNOSIS — S61219A Laceration without foreign body of unspecified finger without damage to nail, initial encounter: Secondary | ICD-10-CM

## 2013-12-03 DIAGNOSIS — S61209A Unspecified open wound of unspecified finger without damage to nail, initial encounter: Secondary | ICD-10-CM | POA: Diagnosis not present

## 2013-12-03 DIAGNOSIS — Y9389 Activity, other specified: Secondary | ICD-10-CM | POA: Insufficient documentation

## 2013-12-03 DIAGNOSIS — Z87891 Personal history of nicotine dependence: Secondary | ICD-10-CM | POA: Diagnosis not present

## 2013-12-03 DIAGNOSIS — Y92009 Unspecified place in unspecified non-institutional (private) residence as the place of occurrence of the external cause: Secondary | ICD-10-CM | POA: Insufficient documentation

## 2013-12-03 DIAGNOSIS — Z23 Encounter for immunization: Secondary | ICD-10-CM | POA: Insufficient documentation

## 2013-12-03 MED ORDER — HYDROCODONE-ACETAMINOPHEN 5-325 MG PO TABS: 1.0000 | ORAL_TABLET | ORAL | Status: AC | PRN

## 2013-12-03 MED ORDER — TETANUS-DIPHTH-ACELL PERTUSSIS 5-2.5-18.5 LF-MCG/0.5 IM SUSP
0.5000 mL | Freq: Once | INTRAMUSCULAR | Status: AC
Start: 2013-12-03 — End: 2013-12-03
  Administered 2013-12-03: 0.5 mL via INTRAMUSCULAR
  Filled 2013-12-03: qty 0.5

## 2013-12-03 NOTE — ED Notes (Signed)
Patient is from home. Patient states she was chopping up cabbage and sliced her right middle finger. Bleeding is controlled at this time. Patient states pain 1/10 states it just tingles. Patient in NAD at this time.

## 2013-12-03 NOTE — ED Provider Notes (Signed)
CSN: [id number]     Arrival date & time 12/03/13  1945 History  This chart was scribed for non-physician practitioner Assunta Found, working with Leota Jacobsen, MD, by Neta Ehlers, ED Scribe. This patient was seen in room WTR5/WTR5 and the patient's care was started at 7:58 PM.   First MD Initiated Contact with Patient 12/03/13 1957     Chief Complaint  Patient presents with  . Laceration    The history is provided by the patient. No language interpreter was used.   HPI Comments: Cindy Vaughn is a 68 y.o. female who presents to the Emergency Department complaining of a laceration to her right third finger which occurred an hour ago as she was chopping cabbage. She reports the pain as 1/10. She denies numbness to the finger. The bleeding is controlled. She denies a h/o of blood thinners. She is unsure of her tetanus status.   Past Medical History  Diagnosis Date  . Varicose veins   . Leg pain   . Leg swelling    Past Surgical History  Procedure Laterality Date  . Cesarean section  1986  . Cholecystectomy    . Arthroscopy knee w/ drilling Right 1995  . Endovenous ablation saphenous vein w/ laser Right 12-20-2012    right Small saphenous vein at [place]  . Endovenous ablation saphenous vein w/ laser Right 03-23-2013    right greater saphenous vein at [facility]   Family History  Problem Relation Age of Onset  . Heart disease Mother   . Cancer Father   . Varicose Veins Father   . Varicose Veins Sister    History  Substance Use Topics  . Smoking status: Former Smoker    Quit date: 09/01/1969  . Smokeless tobacco: Not on file  . Alcohol Use: No   No OB history provided.  Review of Systems  All other systems reviewed and are negative.    Allergies  Review of patient's allergies indicates no known allergies.  Home Medications   Current Outpatient Rx  Name  Route  Sig  Dispense  Refill  . HYDROcodone-acetaminophen (NORCO) 5-325 MG per tablet    Oral   Take 1 tablet by mouth every 4 (four) hours as needed.   8 tablet   0     Triage Vitals: BP 143/79  Pulse 74  Temp(Src) 98.5 F (36.9 C) (Oral)  Resp 18  SpO2 94%  Physical Exam  Nursing note and vitals reviewed. Constitutional: She is oriented to person, place, and time. She appears well-developed and well-nourished. No distress.  HENT:  Head: Normocephalic and atraumatic.  Eyes: Conjunctivae and EOM are normal.  Neck: Normal range of motion. Neck supple. No tracheal deviation present.  Cardiovascular: Normal rate.   Good pulses. Good cap refill, less than 2 seconds.   Pulmonary/Chest: Effort normal. No respiratory distress.  Musculoskeletal: Normal range of motion. She exhibits no edema.  Neurological: She is alert and oriented to person, place, and time.  Good sensation.   Skin: Skin is warm and dry. No rash noted. She is not diaphoretic.  No gaping wound. Right middle finger's distal pad is a shave lesion. The lesion does not extend into subcutaneous tissues.   Psychiatric: She has a normal mood and affect. Her behavior is normal.    ED Course  Procedures (including critical care time)  DIAGNOSTIC STUDIES: Oxygen Saturation is 94% on room air, normal by my interpretation.    COORDINATION OF CARE:  8:19  PM- Discussed treatment plan with patient, and the patient agreed to the plan. The plan includes a tetanus booster and use of hemastatic dressing and pressure dressing.   Labs Review Labs Reviewed - No data to display Imaging Review No results found.   EKG Interpretation None      MDM   Final diagnoses:  Finger laceration   Tourniquet applied to affected finger. Bleeding controlled. No extension of laceration into subcutaneous tissues. Hemastatic dressing applied. Tourniquet removed. Bleeding controlled. Patient advised to keep hemastatic dressing on finger for full 24 hours. Plan to have patient follow up with pcp in 2 days for wound recheck. Return  precautions given should bleeding reoccur and unable to get stopped.   Meds given in ED:  Medications  Tdap (BOOSTRIX) injection 0.5 mL (0.5 mLs Intramuscular Given 12/03/13 2040)    Discharge Medication List as of 12/03/2013  8:29 PM    START taking these medications   Details  HYDROcodone-acetaminophen (NORCO) 5-325 MG per tablet Take 1 tablet by mouth every 4 (four) hours as needed., Starting 12/03/2013, Until Discontinued, Print         I personally performed the services described in this documentation, which was scribed in my presence. The recorded information has been reviewed and is accurate.      Dossie Arbour [place], PA-C 12/04/13 [phone]

## 2013-12-03 NOTE — Discharge Instructions (Signed)
Keep dressing on for 24 hours, to ensure adequate cessation of bleeding. Take pain medication as directed for pain. Follow up with your doctor in 2 days for wound recheck. Return to ED if bleeding reoccurs and unable to get it stopped.     Laceration Care, Adult A laceration is a cut or lesion that goes through all layers of the skin and into the tissue just beneath the skin. TREATMENT  Some lacerations may not require closure. Some lacerations may not be able to be closed due to an increased risk of infection. It is important to see your caregiver as soon as possible after an injury to minimize the risk of infection and maximize the opportunity for successful closure. If closure is appropriate, pain medicines may be given, if needed. The wound will be cleaned to help prevent infection. Your caregiver will use stitches (sutures), staples, wound glue (adhesive), or skin adhesive strips to repair the laceration. These tools bring the skin edges together to allow for faster healing and a better cosmetic outcome. However, all wounds will heal with a scar. Once the wound has healed, scarring can be minimized by covering the wound with sunscreen during the day for 1 full year. HOME CARE INSTRUCTIONS  For sutures or staples:  Keep the wound clean and dry.  If you were given a bandage (dressing), you should change it at least once a day. Also, change the dressing if it becomes wet or dirty, or as directed by your caregiver.  Wash the wound with soap and water 2 times a day. Rinse the wound off with water to remove all soap. Pat the wound dry with a clean towel.  After cleaning, apply a thin layer of the antibiotic ointment as recommended by your caregiver. This will help prevent infection and keep the dressing from sticking.  You may shower as usual after the first 24 hours. Do not soak the wound in water until the sutures are removed.  Only take over-the-counter or prescription medicines for pain,  discomfort, or fever as directed by your caregiver.  Get your sutures or staples removed as directed by your caregiver. For skin adhesive strips:  Keep the wound clean and dry.  Do not get the skin adhesive strips wet. You may bathe carefully, using caution to keep the wound dry.  If the wound gets wet, pat it dry with a clean towel.  Skin adhesive strips will fall off on their own. You may trim the strips as the wound heals. Do not remove skin adhesive strips that are still stuck to the wound. They will fall off in time. For wound adhesive:  You may briefly wet your wound in the shower or bath. Do not soak or scrub the wound. Do not swim. Avoid periods of heavy perspiration until the skin adhesive has fallen off on its own. After showering or bathing, gently pat the wound dry with a clean towel.  Do not apply liquid medicine, cream medicine, or ointment medicine to your wound while the skin adhesive is in place. This may loosen the film before your wound is healed.  If a dressing is placed over the wound, be careful not to apply tape directly over the skin adhesive. This may cause the adhesive to be pulled off before the wound is healed.  Avoid prolonged exposure to sunlight or tanning lamps while the skin adhesive is in place. Exposure to ultraviolet light in the first year will darken the scar.  The skin adhesive will usually  remain in place for 5 to 10 days, then naturally fall off the skin. Do not pick at the adhesive film. You may need a tetanus shot if:  You cannot remember when you had your last tetanus shot.  You have never had a tetanus shot. If you get a tetanus shot, your arm may swell, get red, and feel warm to the touch. This is common and not a problem. If you need a tetanus shot and you choose not to have one, there is a rare chance of getting tetanus. Sickness from tetanus can be serious. SEEK MEDICAL CARE IF:   You have redness, swelling, or increasing pain in the  wound.  You see a red line that goes away from the wound.  You have yellowish-white fluid (pus) coming from the wound.  You have a fever.  You notice a bad smell coming from the wound or dressing.  Your wound breaks open before or after sutures have been removed.  You notice something coming out of the wound such as wood or glass.  Your wound is on your hand or foot and you cannot move a finger or toe. SEEK IMMEDIATE MEDICAL CARE IF:   Your pain is not controlled with prescribed medicine.  You have severe swelling around the wound causing pain and numbness or a change in color in your arm, hand, leg, or foot.  Your wound splits open and starts bleeding.  You have worsening numbness, weakness, or loss of function of any joint around or beyond the wound.  You develop painful lumps near the wound or on the skin anywhere on your body. MAKE SURE YOU:   Understand these instructions.  Will watch your condition.  Will get help right away if you are not doing well or get worse. Document Released: 08/18/2005 Document Revised: 11/10/2011 Document Reviewed: 02/11/2011 [facility] Patient Information 2014 [place], [place].

## 2013-12-04 NOTE — ED Provider Notes (Signed)
Medical screening examination/treatment/procedure(s) were performed by non-physician practitioner and as supervising physician I was immediately available for consultation/collaboration.   EKG Interpretation None       Kassius Battiste T Khamron Gellert, MD 12/04/13 1551 

## 2014-01-24 DIAGNOSIS — M1610 Unilateral primary osteoarthritis, unspecified hip: Secondary | ICD-10-CM | POA: Diagnosis not present

## 2014-01-25 DIAGNOSIS — N959 Unspecified menopausal and perimenopausal disorder: Secondary | ICD-10-CM | POA: Diagnosis not present

## 2014-01-25 DIAGNOSIS — Z1212 Encounter for screening for malignant neoplasm of rectum: Secondary | ICD-10-CM | POA: Diagnosis not present

## 2014-01-25 DIAGNOSIS — Z1231 Encounter for screening mammogram for malignant neoplasm of breast: Secondary | ICD-10-CM | POA: Diagnosis not present

## 2014-01-25 DIAGNOSIS — Z124 Encounter for screening for malignant neoplasm of cervix: Secondary | ICD-10-CM | POA: Diagnosis not present

## 2014-01-25 DIAGNOSIS — Z130 Encounter for screening for diseases of the blood and blood-forming organs and certain disorders involving the immune mechanism: Secondary | ICD-10-CM | POA: Diagnosis not present

## 2014-02-02 DIAGNOSIS — M25559 Pain in unspecified hip: Secondary | ICD-10-CM | POA: Diagnosis not present

## 2014-02-07 DIAGNOSIS — M25559 Pain in unspecified hip: Secondary | ICD-10-CM | POA: Diagnosis not present

## 2014-02-09 DIAGNOSIS — M25569 Pain in unspecified knee: Secondary | ICD-10-CM | POA: Diagnosis not present

## 2014-02-14 DIAGNOSIS — M25559 Pain in unspecified hip: Secondary | ICD-10-CM | POA: Diagnosis not present

## 2014-02-16 DIAGNOSIS — M25559 Pain in unspecified hip: Secondary | ICD-10-CM | POA: Diagnosis not present

## 2014-02-21 DIAGNOSIS — M25559 Pain in unspecified hip: Secondary | ICD-10-CM | POA: Diagnosis not present

## 2014-02-23 DIAGNOSIS — M25559 Pain in unspecified hip: Secondary | ICD-10-CM | POA: Diagnosis not present

## 2014-03-20 DIAGNOSIS — M1610 Unilateral primary osteoarthritis, unspecified hip: Secondary | ICD-10-CM | POA: Diagnosis not present

## 2014-04-03 ENCOUNTER — Other Ambulatory Visit: Payer: Self-pay | Admitting: Dermatology

## 2014-04-03 DIAGNOSIS — L821 Other seborrheic keratosis: Secondary | ICD-10-CM | POA: Diagnosis not present

## 2014-04-03 DIAGNOSIS — L820 Inflamed seborrheic keratosis: Secondary | ICD-10-CM | POA: Diagnosis not present

## 2014-04-03 DIAGNOSIS — IMO0002 Reserved for concepts with insufficient information to code with codable children: Secondary | ICD-10-CM | POA: Diagnosis not present

## 2014-04-03 DIAGNOSIS — L608 Other nail disorders: Secondary | ICD-10-CM | POA: Diagnosis not present

## 2014-04-07 DIAGNOSIS — L608 Other nail disorders: Secondary | ICD-10-CM | POA: Diagnosis not present

## 2014-05-30 DIAGNOSIS — B351 Tinea unguium: Secondary | ICD-10-CM | POA: Diagnosis not present

## 2014-05-30 DIAGNOSIS — Z79899 Other long term (current) drug therapy: Secondary | ICD-10-CM | POA: Diagnosis not present

## 2014-07-31 DIAGNOSIS — R220 Localized swelling, mass and lump, head: Secondary | ICD-10-CM | POA: Diagnosis not present

## 2014-07-31 DIAGNOSIS — B351 Tinea unguium: Secondary | ICD-10-CM | POA: Diagnosis not present

## 2014-07-31 DIAGNOSIS — B372 Candidiasis of skin and nail: Secondary | ICD-10-CM | POA: Diagnosis not present

## 2014-07-31 DIAGNOSIS — Z79899 Other long term (current) drug therapy: Secondary | ICD-10-CM | POA: Diagnosis not present

## 2014-08-04 DIAGNOSIS — H26491 Other secondary cataract, right eye: Secondary | ICD-10-CM | POA: Diagnosis not present

## 2014-08-04 DIAGNOSIS — H59092 Other disorders of the left eye following cataract surgery: Secondary | ICD-10-CM | POA: Diagnosis not present

## 2014-08-08 DIAGNOSIS — R220 Localized swelling, mass and lump, head: Secondary | ICD-10-CM | POA: Diagnosis not present

## 2014-09-05 DIAGNOSIS — H18411 Arcus senilis, right eye: Secondary | ICD-10-CM | POA: Diagnosis not present

## 2014-09-05 DIAGNOSIS — Z961 Presence of intraocular lens: Secondary | ICD-10-CM | POA: Diagnosis not present

## 2014-09-05 DIAGNOSIS — H26492 Other secondary cataract, left eye: Secondary | ICD-10-CM | POA: Diagnosis not present

## 2014-09-05 DIAGNOSIS — H02839 Dermatochalasis of unspecified eye, unspecified eyelid: Secondary | ICD-10-CM | POA: Diagnosis not present

## 2014-09-12 DIAGNOSIS — H26492 Other secondary cataract, left eye: Secondary | ICD-10-CM | POA: Diagnosis not present

## 2014-10-10 DIAGNOSIS — Z1211 Encounter for screening for malignant neoplasm of colon: Secondary | ICD-10-CM | POA: Diagnosis not present

## 2014-10-10 DIAGNOSIS — K219 Gastro-esophageal reflux disease without esophagitis: Secondary | ICD-10-CM | POA: Diagnosis not present

## 2014-10-27 DIAGNOSIS — Z1211 Encounter for screening for malignant neoplasm of colon: Secondary | ICD-10-CM | POA: Diagnosis not present

## 2014-10-27 DIAGNOSIS — K5730 Diverticulosis of large intestine without perforation or abscess without bleeding: Secondary | ICD-10-CM | POA: Diagnosis not present

## 2014-11-10 DIAGNOSIS — M1711 Unilateral primary osteoarthritis, right knee: Secondary | ICD-10-CM | POA: Diagnosis not present

## 2014-11-10 DIAGNOSIS — M25561 Pain in right knee: Secondary | ICD-10-CM | POA: Diagnosis not present

## 2014-12-25 DIAGNOSIS — R5383 Other fatigue: Secondary | ICD-10-CM | POA: Diagnosis not present

## 2014-12-25 DIAGNOSIS — Z23 Encounter for immunization: Secondary | ICD-10-CM | POA: Diagnosis not present

## 2014-12-25 DIAGNOSIS — D179 Benign lipomatous neoplasm, unspecified: Secondary | ICD-10-CM | POA: Diagnosis not present

## 2014-12-28 DIAGNOSIS — K5792 Diverticulitis of intestine, part unspecified, without perforation or abscess without bleeding: Secondary | ICD-10-CM | POA: Diagnosis not present

## 2015-02-06 DIAGNOSIS — Z1231 Encounter for screening mammogram for malignant neoplasm of breast: Secondary | ICD-10-CM | POA: Diagnosis not present

## 2015-02-06 DIAGNOSIS — Z01419 Encounter for gynecological examination (general) (routine) without abnormal findings: Secondary | ICD-10-CM | POA: Diagnosis not present

## 2015-02-06 DIAGNOSIS — Z6830 Body mass index (BMI) 30.0-30.9, adult: Secondary | ICD-10-CM | POA: Diagnosis not present

## 2015-02-12 ENCOUNTER — Other Ambulatory Visit: Payer: Self-pay | Admitting: Obstetrics and Gynecology

## 2015-02-12 DIAGNOSIS — R928 Other abnormal and inconclusive findings on diagnostic imaging of breast: Secondary | ICD-10-CM

## 2015-02-15 ENCOUNTER — Ambulatory Visit
Admission: RE | Admit: 2015-02-15 | Discharge: 2015-02-15 | Disposition: A | Discharge status: Home / Self Care | Payer: Medicare Other | Source: Ambulatory Visit | Attending: Obstetrics and Gynecology | Admitting: Obstetrics and Gynecology

## 2015-02-15 ENCOUNTER — Other Ambulatory Visit: Payer: Self-pay | Admitting: Obstetrics and Gynecology

## 2015-02-15 DIAGNOSIS — N6320 Unspecified lump in the left breast, unspecified quadrant: Secondary | ICD-10-CM

## 2015-02-15 DIAGNOSIS — N63 Unspecified lump in breast: Secondary | ICD-10-CM | POA: Diagnosis not present

## 2015-02-15 DIAGNOSIS — R928 Other abnormal and inconclusive findings on diagnostic imaging of breast: Secondary | ICD-10-CM

## 2015-02-21 ENCOUNTER — Ambulatory Visit
Admission: RE | Admit: 2015-02-21 | Discharge: 2015-02-21 | Disposition: A | Discharge status: Home / Self Care | Payer: Medicare Other | Source: Ambulatory Visit | Attending: Obstetrics and Gynecology | Admitting: Obstetrics and Gynecology

## 2015-02-21 ENCOUNTER — Other Ambulatory Visit: Payer: Self-pay | Admitting: Obstetrics and Gynecology

## 2015-02-21 DIAGNOSIS — N6320 Unspecified lump in the left breast, unspecified quadrant: Secondary | ICD-10-CM

## 2015-02-21 DIAGNOSIS — N6012 Diffuse cystic mastopathy of left breast: Secondary | ICD-10-CM | POA: Diagnosis not present

## 2015-02-21 DIAGNOSIS — N63 Unspecified lump in breast: Secondary | ICD-10-CM | POA: Diagnosis not present

## 2015-02-22 ENCOUNTER — Other Ambulatory Visit: Payer: Medicare Other

## 2015-03-13 DIAGNOSIS — N6019 Diffuse cystic mastopathy of unspecified breast: Secondary | ICD-10-CM | POA: Diagnosis not present

## 2015-03-16 ENCOUNTER — Other Ambulatory Visit: Payer: Self-pay | Admitting: Obstetrics and Gynecology

## 2015-03-16 DIAGNOSIS — N6489 Other specified disorders of breast: Secondary | ICD-10-CM

## 2015-03-27 ENCOUNTER — Ambulatory Visit
Admission: RE | Admit: 2015-03-27 | Discharge: 2015-03-27 | Disposition: A | Discharge status: Home / Self Care | Payer: Medicare Other | Source: Ambulatory Visit | Attending: Obstetrics and Gynecology | Admitting: Obstetrics and Gynecology

## 2015-03-27 DIAGNOSIS — N6489 Other specified disorders of breast: Secondary | ICD-10-CM | POA: Diagnosis not present

## 2015-03-27 MED ORDER — GADOBENATE DIMEGLUMINE 529 MG/ML IV SOLN
16.0000 mL | Freq: Once | INTRAVENOUS | Status: AC | PRN
  Administered 2015-03-27: 16 mL via INTRAVENOUS

## 2015-07-23 ENCOUNTER — Other Ambulatory Visit: Payer: Self-pay | Admitting: Obstetrics and Gynecology

## 2015-07-23 DIAGNOSIS — N6489 Other specified disorders of breast: Secondary | ICD-10-CM

## 2015-08-15 DIAGNOSIS — H43812 Vitreous degeneration, left eye: Secondary | ICD-10-CM | POA: Diagnosis not present

## 2015-08-15 DIAGNOSIS — Z961 Presence of intraocular lens: Secondary | ICD-10-CM | POA: Diagnosis not present

## 2015-08-21 ENCOUNTER — Other Ambulatory Visit: Payer: Self-pay | Admitting: Obstetrics and Gynecology

## 2015-08-21 ENCOUNTER — Ambulatory Visit
Admission: RE | Admit: 2015-08-21 | Discharge: 2015-08-21 | Disposition: A | Discharge status: Home / Self Care | Payer: Medicare Other | Source: Ambulatory Visit | Attending: Obstetrics and Gynecology | Admitting: Obstetrics and Gynecology

## 2015-08-21 DIAGNOSIS — R928 Other abnormal and inconclusive findings on diagnostic imaging of breast: Secondary | ICD-10-CM | POA: Diagnosis not present

## 2015-08-21 DIAGNOSIS — N6489 Other specified disorders of breast: Secondary | ICD-10-CM

## 2015-09-24 DIAGNOSIS — M1711 Unilateral primary osteoarthritis, right knee: Secondary | ICD-10-CM | POA: Diagnosis not present

## 2016-01-14 ENCOUNTER — Other Ambulatory Visit: Payer: Self-pay

## 2016-01-14 DIAGNOSIS — Z1231 Encounter for screening mammogram for malignant neoplasm of breast: Secondary | ICD-10-CM

## 2016-02-07 ENCOUNTER — Ambulatory Visit: Payer: TRICARE For Life (TFL)

## 2016-02-12 DIAGNOSIS — Z1231 Encounter for screening mammogram for malignant neoplasm of breast: Secondary | ICD-10-CM | POA: Diagnosis not present

## 2016-02-12 DIAGNOSIS — M8588 Other specified disorders of bone density and structure, other site: Secondary | ICD-10-CM | POA: Diagnosis not present

## 2016-02-12 DIAGNOSIS — Z124 Encounter for screening for malignant neoplasm of cervix: Secondary | ICD-10-CM | POA: Diagnosis not present

## 2016-02-12 DIAGNOSIS — Z6828 Body mass index (BMI) 28.0-28.9, adult: Secondary | ICD-10-CM | POA: Diagnosis not present

## 2016-02-12 DIAGNOSIS — N958 Other specified menopausal and perimenopausal disorders: Secondary | ICD-10-CM | POA: Diagnosis not present

## 2016-04-21 DIAGNOSIS — H524 Presbyopia: Secondary | ICD-10-CM | POA: Diagnosis not present

## 2016-04-21 DIAGNOSIS — H04123 Dry eye syndrome of bilateral lacrimal glands: Secondary | ICD-10-CM | POA: Diagnosis not present

## 2016-04-21 DIAGNOSIS — H52223 Regular astigmatism, bilateral: Secondary | ICD-10-CM | POA: Diagnosis not present

## 2016-04-21 DIAGNOSIS — H5203 Hypermetropia, bilateral: Secondary | ICD-10-CM | POA: Diagnosis not present

## 2016-05-06 DIAGNOSIS — H16223 Keratoconjunctivitis sicca, not specified as Sjogren's, bilateral: Secondary | ICD-10-CM | POA: Diagnosis not present

## 2016-05-06 DIAGNOSIS — H524 Presbyopia: Secondary | ICD-10-CM | POA: Diagnosis not present

## 2016-05-06 DIAGNOSIS — H52223 Regular astigmatism, bilateral: Secondary | ICD-10-CM | POA: Diagnosis not present

## 2016-05-06 DIAGNOSIS — H04123 Dry eye syndrome of bilateral lacrimal glands: Secondary | ICD-10-CM | POA: Diagnosis not present

## 2016-05-06 DIAGNOSIS — H5202 Hypermetropia, left eye: Secondary | ICD-10-CM | POA: Diagnosis not present

## 2016-09-15 DIAGNOSIS — H6980 Other specified disorders of Eustachian tube, unspecified ear: Secondary | ICD-10-CM | POA: Diagnosis not present

## 2016-09-15 DIAGNOSIS — M1612 Unilateral primary osteoarthritis, left hip: Secondary | ICD-10-CM | POA: Diagnosis not present

## 2016-09-15 DIAGNOSIS — M26602 Left temporomandibular joint disorder, unspecified: Secondary | ICD-10-CM | POA: Diagnosis not present

## 2016-10-07 DIAGNOSIS — Z0000 Encounter for general adult medical examination without abnormal findings: Secondary | ICD-10-CM | POA: Diagnosis not present

## 2016-10-07 DIAGNOSIS — Z1389 Encounter for screening for other disorder: Secondary | ICD-10-CM | POA: Diagnosis not present

## 2016-10-07 DIAGNOSIS — M1612 Unilateral primary osteoarthritis, left hip: Secondary | ICD-10-CM | POA: Diagnosis not present

## 2016-10-07 DIAGNOSIS — Z136 Encounter for screening for cardiovascular disorders: Secondary | ICD-10-CM | POA: Diagnosis not present

## 2016-10-07 DIAGNOSIS — Z13220 Encounter for screening for lipoid disorders: Secondary | ICD-10-CM | POA: Diagnosis not present

## 2016-10-07 DIAGNOSIS — Z131 Encounter for screening for diabetes mellitus: Secondary | ICD-10-CM | POA: Diagnosis not present

## 2016-10-07 DIAGNOSIS — Z1159 Encounter for screening for other viral diseases: Secondary | ICD-10-CM | POA: Diagnosis not present

## 2016-10-10 ENCOUNTER — Other Ambulatory Visit (HOSPITAL_COMMUNITY): Payer: Self-pay | Admitting: Oral and Maxillofacial Surgery

## 2016-10-10 DIAGNOSIS — R220 Localized swelling, mass and lump, head: Secondary | ICD-10-CM

## 2016-10-10 DIAGNOSIS — R221 Localized swelling, mass and lump, neck: Secondary | ICD-10-CM

## 2016-10-15 ENCOUNTER — Encounter: Payer: Self-pay | Admitting: Physical Therapy

## 2016-10-15 ENCOUNTER — Ambulatory Visit: Payer: Medicare Other | Attending: Orthopedic Surgery | Admitting: Physical Therapy

## 2016-10-15 DIAGNOSIS — M25552 Pain in left hip: Secondary | ICD-10-CM | POA: Diagnosis not present

## 2016-10-15 DIAGNOSIS — M6281 Muscle weakness (generalized): Secondary | ICD-10-CM

## 2016-10-15 DIAGNOSIS — M25652 Stiffness of left hip, not elsewhere classified: Secondary | ICD-10-CM

## 2016-10-15 DIAGNOSIS — M25662 Stiffness of left knee, not elsewhere classified: Secondary | ICD-10-CM

## 2016-10-15 DIAGNOSIS — R2689 Other abnormalities of gait and mobility: Secondary | ICD-10-CM | POA: Diagnosis not present

## 2016-10-15 NOTE — Therapy (Signed)
[facility] [address] [place], [place], [address] Phone: [phone]   Fax:  [phone]  Physical Therapy Evaluation  Patient Details  Name: SUDE BYERS MRN: [id number] Date of Birth: 1946/03/07 Referring Provider: Dr. Latanya Maudlin  Encounter Date: 10/15/2016      PT End of Session - 10/15/16 1149    Visit Number 1   Number of Visits 10   Date for PT Re-Evaluation 12/10/16   Authorization Type medicare 10th visit g-code   PT Start Time 1110  came late and wrong time   PT Stop Time 1145   PT Time Calculation (min) 35 min   Activity Tolerance Patient tolerated treatment well   Behavior During Therapy [facility] for tasks assessed/performed      Past Medical History:  Diagnosis Date  . Leg pain   . Leg swelling   . Varicose veins     Past Surgical History:  Procedure Laterality Date  . ARTHROSCOPY KNEE W/ DRILLING Right 1995  . CESAREAN SECTION  1986  . CHOLECYSTECTOMY    . ENDOVENOUS ABLATION SAPHENOUS VEIN W/ LASER Right 12-20-2012   right Small saphenous vein at [place]  . ENDOVENOUS ABLATION SAPHENOUS VEIN W/ LASER Right 03-23-2013   right greater saphenous vein at [facility]    There were no vitals filed for this visit.       Subjective Assessment - 10/15/16 1113    Subjective Patient reports she is unable to place sock and shoe on left foot due to inability to cross left leg over right. Patient wears slip on shoes and no socks on left foot.    Patient Stated Goals put shoe and sock on left foot wihout an issue   Currently in Pain? Yes   Pain Score 10-Worst pain ever  walking around 2/10   Pain Location Hip   Pain Orientation Left;Posterior   Pain Descriptors / Indicators Discomfort   Pain Type Chronic pain   Pain Onset More than a month ago   Pain Frequency Intermittent   Aggravating Factors  putting a shoe and sock on. walking,    Pain Relieving Factors no movement   Multiple  Pain Sites No            [facility] PT Assessment - 10/15/16 0001      Assessment   Medical Diagnosis M16.12 Primary osteoarthritis of left hip; M25.552 left hip pain   Referring Provider Dr. Latanya Maudlin   Onset Date/Surgical Date 04/01/16   Prior Therapy None     Precautions   Precautions None     Restrictions   Weight Bearing Restrictions No     Balance Screen   Has the patient fallen in the past 6 months No   Has the patient had a decrease in activity level because of a fear of falling?  No   Is the patient reluctant to leave their home because of a fear of falling?  No     Home Environment   Living Environment Private residence   Living Arrangements Alone   Type of [place] to enter   [place] Two level  bedroom is upstairs     Prior Function   Level of Independence Independent   Vocation Retired     Associate Professor   Overall Cognitive Status Within Functional Limits for tasks assessed     Observation/Other Assessments   Focus on Therapeutic Outcomes (FOTO)  53% limitation  goal  is 43% limitation     Posture/Postural Control   Posture/Postural Control No significant limitations     ROM / Strength   AROM / PROM / Strength PROM;Strength;AROM     AROM   Lumbar Flexion full   Lumbar Extension decreased by 50%   Lumbar - Right Side Bend decreased by 25%  pulling on left   Lumbar - Left Side Bend full   Lumbar - Right Rotation full   Lumbar - Left Rotation full     PROM   Left Hip Extension -8  AROM -10   Left Hip Flexion 90   Left Hip External Rotation  12   Left Hip Internal Rotation  3   Left Hip ABduction 8   Right Knee Extension -10   Left Knee Extension -5     Strength   Left Hip Flexion 3/5   Left Hip Extension 3/5   Left Hip External Rotation 3/5   Left Hip Internal Rotation 3/5   Left Hip ABduction 3/5   Left Hip ADduction 3/5   Right Knee Extension 3/5   Left Knee Extension 3/5     Palpation   Spinal mobility  L2-L5 rotated left   Palpation comment tenderness located in left gluteus medius, posterior left hip; decreased mobility of left hip capsule     Special Tests    Special Tests Hip Special Tests   Hip Special Tests  Saralyn Pilar (FABER) Test;Hip Scouring     Saralyn Pilar (FABER) Test   Findings Positive   Side Left   Comments pain     Hip Scouring   Findings Positive   Side Left   Comments capsule tight with pain     Transfers   Transfers Sit to Stand  use hands, not able to sit in low chair     Ambulation/Gait   Ambulation/Gait Yes   Gait Pattern Decreased step length - right;Decreased step length - left;Decreased stride length;Decreased hip/knee flexion - left;Decreased hip/knee flexion - right  decreased left hip extension   Stairs Yes   Stair Management Technique Step to pattern  due to right knee pain                           PT Education - 10/15/16 1149    Education provided No          PT Short Term Goals - 10/15/16 1202      PT SHORT TERM GOAL #1   Title independent with initial HEP   Time 4   Period Weeks   Status New     PT SHORT TERM GOAL #2   Title able to put her shoes and socks on with moderate difficulty due to increased left hip ROM   Time 4   Period Weeks   Status New     PT SHORT TERM GOAL #3   Title ability to roll in bed with moderate pain due to decreased tissue tightness   Time 4   Period Weeks   Status New     PT SHORT TERM GOAL #4   Title hip flexion >/= 100 degrees making it easier to get in and out of a chair   Time 4   Period Weeks   Status New           PT Long Term Goals - 10/15/16 1206      PT LONG TERM GOAL #1   Title independent with HEP  Time 8   Period Weeks   Status New     PT LONG TERM GOAL #2   Title roll in bed with minimal to no pain due to decreased trigger points in left hip   Time 8   Period Weeks   Status New     PT LONG TERM GOAL #3   Title ability to put her shoes and socks on  left hip with minimal difficulty due to increased left hip ER >/= 50 degress    Time 8   Period Weeks   Status New     PT LONG TERM GOAL #4   Title sit to stand with minimal difficulty due to increased strength in left hip and bil. knees   Time 8   Period Weeks   Status New     PT LONG TERM GOAL #5   Title FOTO score </= 43% limitation   Time 8   Period Weeks     Additional Long Term Goals   Additional Long Term Goals Yes     PT LONG TERM GOAL #6   Title ambulate with no pain due to increased stride length and increased left hip extension AROM >/= 5 degrees   Time 8   Period Weeks   Status New               Plan - 10/15/16 1150    Clinical Impression Statement Patient is a 71 year old female with left hip pain that has been chronic but worse in the past 6 months.  Patient reports her intermittent left hip pain is at level 10/10 with putting shoes and socks on and other activities is 2/10.  Left hip motion is limited by 75% in all directions and left hip capsule is tight in all areas.  Patient has decreased bilateral knee extension.  .Left hip strength is 3/5 and bilateral knee extension is 4-/5.  Patient ambulates with decreased bilateral hip and knee extension and small stride length. Patient has difficulty with sitting in low chair due to right knee weakness and decreased left hip flexion.  Patient will go up and down stairs with step to step pattern due to right knee pain.  Palpable tendenress located in left posterior hip and left gluteus medius.  Patient is low  complexity eval with evolving condition but no comorbidities that will affect her care.  Pateint will benfit from skilled therapy to improve left hip ROM and bilateral knee extension while improve strength of left hip and knees.    Rehab Potential Excellent   Clinical Impairments Affecting Rehab Potential None   PT Frequency 2x / week   PT Duration 8 weeks   PT Treatment/Interventions Cryotherapy;Electrical  Stimulation;Iontophoresis 4mg /ml Dexamethasone;Stair training;Gait training;Ultrasound;Moist Heat;Therapeutic activities;Therapeutic exercise;Neuromuscular re-education;Patient/family education;Passive range of motion;Manual techniques;Dry needling;Taping   PT Next Visit Plan joint mobilization to left hip and knee; strength of left hip and knee; soft tissue work of left hip; gait training; modalities as needed   PT Home Exercise Plan hip stretches   Recommended Other Services none   Consulted and Agree with Plan of Care Patient      Patient will benefit from skilled therapeutic intervention in order to improve the following deficits and impairments:  Abnormal gait, Difficulty walking, Pain, Decreased strength, Decreased mobility, Decreased activity tolerance, Impaired flexibility, Increased muscle spasms, Decreased range of motion  Visit Diagnosis: Pain in left hip - Plan: PT plan of care cert/re-cert  Stiffness of left hip, not elsewhere classified -  Plan: PT plan of care cert/re-cert  Stiffness of left knee, not elsewhere classified - Plan: PT plan of care cert/re-cert  Other abnormalities of gait and mobility - Plan: PT plan of care cert/re-cert  Muscle weakness (generalized) - Plan: PT plan of care cert/re-cert      G-Codes - [id number]    Functional Assessment Tool Used FOTO score is 53% limitation  goal is 43% limitation   Functional Limitation Mobility: Walking and moving around   Mobility: Walking and Moving Around Current Status [phone]) At least 40 percent but less than 60 percent impaired, limited or restricted   Mobility: Walking and Moving Around Goal Status [phone]) At least 40 percent but less than 60 percent impaired, limited or restricted   Mobility: Walking and Moving Around Discharge Status [id number]) At least 40 percent but less than 60 percent impaired, limited or restricted       Problem List Patient Active Problem List   Diagnosis Date Noted  . Varicose  veins of lower extremities with other complications [id number]  . Pain in joint, lower leg 08/09/2013    Earlie Counts, PT 10/15/16 12:12 PM   [facility] [address], [place], [place], [address] Phone: [phone]   Fax:  [phone]  Name: ANTJE RAFFO MRN: [id number] Date of Birth: 1946-02-25

## 2016-10-17 ENCOUNTER — Encounter (HOSPITAL_COMMUNITY): Payer: Self-pay

## 2016-10-17 ENCOUNTER — Ambulatory Visit (HOSPITAL_COMMUNITY)
Admission: RE | Admit: 2016-10-17 | Discharge: 2016-10-17 | Disposition: A | Discharge status: Home / Self Care | Payer: Medicare Other | Source: Ambulatory Visit | Attending: Oral and Maxillofacial Surgery | Admitting: Oral and Maxillofacial Surgery

## 2016-10-21 ENCOUNTER — Encounter: Payer: Self-pay | Admitting: Physical Therapy

## 2016-10-21 ENCOUNTER — Ambulatory Visit: Payer: Medicare Other | Admitting: Physical Therapy

## 2016-10-21 DIAGNOSIS — M25652 Stiffness of left hip, not elsewhere classified: Secondary | ICD-10-CM

## 2016-10-21 DIAGNOSIS — M25552 Pain in left hip: Secondary | ICD-10-CM

## 2016-10-21 DIAGNOSIS — R2689 Other abnormalities of gait and mobility: Secondary | ICD-10-CM

## 2016-10-21 DIAGNOSIS — M6281 Muscle weakness (generalized): Secondary | ICD-10-CM | POA: Diagnosis not present

## 2016-10-21 DIAGNOSIS — M25662 Stiffness of left knee, not elsewhere classified: Secondary | ICD-10-CM

## 2016-10-21 NOTE — Therapy (Signed)
[facility] [address], [place], [place], [address] Phone: [phone]   Fax:  [phone]  Physical Therapy Treatment  Patient Details  Name: Cindy Vaughn MRN: [id number] Date of Birth: [birth year] Referring Provider: Dr. Latanya Maudlin  Encounter Date: 10/21/2016      PT End of Session - 10/21/16 1153    Visit Number 2   Number of Visits 10   Date for PT Re-Evaluation 12/10/16   Authorization Type medicare 10th visit g-code   PT Start Time 1100   PT Stop Time 1153   PT Time Calculation (min) 53 min   Activity Tolerance Patient tolerated treatment well   Behavior During Therapy [facility] for tasks assessed/performed      Past Medical History:  Diagnosis Date  . Leg pain   . Leg swelling   . Varicose veins     Past Surgical History:  Procedure Laterality Date  . ARTHROSCOPY KNEE W/ DRILLING Right 1995  . CESAREAN SECTION  1986  . CHOLECYSTECTOMY    . ENDOVENOUS ABLATION SAPHENOUS VEIN W/ LASER Right 12-20-2012   right Small saphenous vein at [place]  . ENDOVENOUS ABLATION SAPHENOUS VEIN W/ LASER Right 03-23-2013   right greater saphenous vein at [facility]    There were no vitals filed for this visit.      Subjective Assessment - 10/21/16 1108    Subjective No changes since the evaluation.     Patient Stated Goals put shoe and sock on left foot wihout an issue   Currently in Pain? Yes   Pain Score 10-Worst pain ever   Pain Location Hip   Pain Orientation Left;Posterior   Pain Descriptors / Indicators Discomfort   Pain Type Chronic pain   Pain Onset More than a month ago   Pain Frequency Intermittent   Aggravating Factors  putting a shoe and sock on, walking   Pain Relieving Factors no movement   Multiple Pain Sites No                         [facility] Adult PT Treatment/Exercise - 10/21/16 0001      Knee/Hip Exercises: Standing   Terminal Knee Extension  Strengthening;Left;1 set;15 reps  vc to contract gluteals and straighten knee   Theraband Level (Terminal Knee Extension) Level 2 (Red)   Forward Step Up Left;1 set;Hand Hold: 2;10 reps   Forward Step Up Limitations VC on hip extension     Knee/Hip Exercises: Supine   Quad Sets Strengthening;Left;1 set;15 reps   Quad Sets Limitations needed tactile cues posterior left knee    Bridges Both;Strengthening;1 set;20 reps;Limitations   Bridges Limitations knees on bolster; tactile cues to contract gluteals   Other Supine Knee/Hip Exercises left hip AAROM for hip abduction 20 times with endrange stretch     Manual Therapy   Manual Therapy Joint mobilization;Soft tissue mobilization   Joint Mobilization left hip with mulligan strap grade 3 for lateral distraction, inferior glide, posterior glide; left knee mobilization to increase knee extension   Soft tissue mobilization left quads, psoas, iliotibial band, medial hamstring                 PT Education - 10/21/16 1139    Education provided Yes   Education Details hip strength and stretch   Person(s) Educated Patient   Methods Explanation;Verbal cues;Handout   Comprehension Verbalized understanding;Returned demonstration          PT Short  Term Goals - 10/21/16 1156      PT SHORT TERM GOAL #1   Title independent with initial HEP   Time 4   Period Weeks   Status On-going     PT SHORT TERM GOAL #2   Title able to put her shoes and socks on with moderate difficulty due to increased left hip ROM   Time 4   Period Weeks   Status On-going     PT SHORT TERM GOAL #3   Title ability to roll in bed with moderate pain due to decreased tissue tightness   Time 4   Period Weeks   Status On-going     PT SHORT TERM GOAL #4   Title hip flexion >/= 100 degrees making it easier to get in and out of a chair   Time 4   Period Weeks   Status On-going           PT Long Term Goals - 10/15/16 1206      PT LONG TERM GOAL #1    Title independent with HEP   Time 8   Period Weeks   Status New     PT LONG TERM GOAL #2   Title roll in bed with minimal to no pain due to decreased trigger points in left hip   Time 8   Period Weeks   Status New     PT LONG TERM GOAL #3   Title ability to put her shoes and socks on left hip with minimal difficulty due to increased left hip ER >/= 50 degress    Time 8   Period Weeks   Status New     PT LONG TERM GOAL #4   Title sit to stand with minimal difficulty due to increased strength in left hip and bil. knees   Time 8   Period Weeks   Status New     PT LONG TERM GOAL #5   Title FOTO score </= 43% limitation   Time 8   Period Weeks     Additional Long Term Goals   Additional Long Term Goals Yes     PT LONG TERM GOAL #6   Title ambulate with no pain due to increased stride length and increased left hip extension AROM >/= 5 degrees   Time 8   Period Weeks   Status New               Plan - 10/21/16 1153    Clinical Impression Statement Patient had tightness located in anterior left hip making it difficult to fully extend left hip. Patient had difficulty with contracting her left gluteal and quads but after therapy she was able to. After therapy patient was able to extend her left hip and knee with greater ease and less discomfort. Patient will benefit from skilled therapy to improve mobility and strength while reducing pain.    Rehab Potential Excellent   Clinical Impairments Affecting Rehab Potential None   PT Frequency 2x / week   PT Duration 8 weeks   PT Treatment/Interventions Cryotherapy;Electrical Stimulation;Iontophoresis 4mg /ml Dexamethasone;Stair training;Gait training;Ultrasound;Moist Heat;Therapeutic activities;Therapeutic exercise;Neuromuscular re-education;Patient/family education;Passive range of motion;Manual techniques;Dry needling;Taping   PT Next Visit Plan joint mobilization to left hip and knee; strength of left hip and knee; soft tissue  work of left hip; gait training; modalities as needed   PT Home Exercise Plan TKE, step up   Consulted and Agree with Plan of Care Patient      Patient will  benefit from skilled therapeutic intervention in order to improve the following deficits and impairments:  Abnormal gait, Difficulty walking, Pain, Decreased strength, Decreased mobility, Decreased activity tolerance, Impaired flexibility, Increased muscle spasms, Decreased range of motion  Visit Diagnosis: Muscle weakness (generalized)  Other abnormalities of gait and mobility  Stiffness of left knee, not elsewhere classified  Stiffness of left hip, not elsewhere classified  Pain in left hip     Problem List Patient Active Problem List   Diagnosis Date Noted  . Varicose veins of lower extremities with other complications [id number]  . Pain in joint, lower leg 08/09/2013    Earlie Counts, PT 10/21/16 11:59 AM   [facility] [address], [place], [place], [address] Phone: [phone]   Fax:  [phone]  Name: MARGARETANN LEVERING MRN: [id number] Date of Birth: Nov 26, 1945

## 2016-10-21 NOTE — Patient Instructions (Addendum)
   Gluteal Squeeze    Squeeze buttocks muscles as tightly as possible while counting out loud to _2___. Repeat __10__ times. Do __2__ sessions per day.  http://gt2.exer.us/364   Copyright  VHI. All rights reserved.   ABDUCTION: Supine (Active)    Lie on back, legs straight. Draw right leg out to the side as far as possible. Use __0_ lbs. Complete _1__ sets of __15_ repetitions. Perform _2__ sessions per day.  Copyright  VHI. All rights reserved.   Quad Set    With other leg bent, foot flat, slowly tighten muscles on thigh of straight leg while counting out loud to _5___. Repeat with other leg. Repeat _10___ times. Do __2__ sessions per day.  http://gt2.exer.us/276   Copyright  VHI. All rights reserved.   Quads / HF, Supine    Lie near edge of bed, one leg bent, foot flat on bed. Other leg hanging over edge, relaxed, thigh resting entirely on bed. Bend hanging knee backward keeping thigh in contact with bed. Hold _10__ seconds.  Repeat __3_ times per session. Do _2__ sessions per day.  Copyright  VHI. All rights reserved.  Strengthening: Straight Leg Raise (Phase 1)    Tighten muscles on front of right thigh, and stomach then lift left leg __4__ inches from surface, keeping knee locked.  Repeat __5__ times per set. Do __1__ sets per session. Do __2__ sessions per day.  http://orth.exer.us/614   Copyright  VHI. All rights reserved.   [facility] [address] [place], [place] [address] Phone # [phone] Fax [phone]

## 2016-10-23 ENCOUNTER — Encounter: Payer: Self-pay | Admitting: Physical Therapy

## 2016-10-23 ENCOUNTER — Ambulatory Visit: Payer: Medicare Other | Admitting: Physical Therapy

## 2016-10-23 DIAGNOSIS — R2689 Other abnormalities of gait and mobility: Secondary | ICD-10-CM

## 2016-10-23 DIAGNOSIS — M25662 Stiffness of left knee, not elsewhere classified: Secondary | ICD-10-CM | POA: Diagnosis not present

## 2016-10-23 DIAGNOSIS — M25652 Stiffness of left hip, not elsewhere classified: Secondary | ICD-10-CM | POA: Diagnosis not present

## 2016-10-23 DIAGNOSIS — M6281 Muscle weakness (generalized): Secondary | ICD-10-CM | POA: Diagnosis not present

## 2016-10-23 DIAGNOSIS — M25552 Pain in left hip: Secondary | ICD-10-CM

## 2016-10-23 NOTE — Therapy (Signed)
[facility] [address] [place], [place], [address] Phone: [phone]   Fax:  [phone]  Physical Therapy Treatment  Patient Details  Name: Cindy Vaughn MRN: [id number] Date of Birth: 04-02-1946 Referring Provider: Dr. Latanya Maudlin  Encounter Date: 10/23/2016      PT End of Session - 10/23/16 1219    Visit Number 3   Number of Visits 10   Date for PT Re-Evaluation 12/10/16   Authorization Type medicare 10th visit g-code   PT Start Time [id number]   PT Stop Time 1223   PT Time Calculation (min) 38 min   Activity Tolerance Patient tolerated treatment well   Behavior During Therapy [facility] for tasks assessed/performed      Past Medical History:  Diagnosis Date  . Leg pain   . Leg swelling   . Varicose veins     Past Surgical History:  Procedure Laterality Date  . ARTHROSCOPY KNEE W/ DRILLING Right 1995  . CESAREAN SECTION  1986  . CHOLECYSTECTOMY    . ENDOVENOUS ABLATION SAPHENOUS VEIN W/ LASER Right 12-20-2012   right Small saphenous vein at [place]  . ENDOVENOUS ABLATION SAPHENOUS VEIN W/ LASER Right 03-23-2013   right greater saphenous vein at [facility]    There were no vitals filed for this visit.      Subjective Assessment - 10/23/16 1152    Subjective Patient can lay on right side without left hip pain. I have not tried to put a shoe on yet.  Walking with by 25%.    Patient Stated Goals put shoe and sock on left foot wihout an issue   Currently in Pain? Yes   Pain Score 4    Pain Location Hip   Pain Orientation Left;Posterior   Pain Descriptors / Indicators Discomfort   Pain Type Chronic pain   Pain Radiating Towards none   Pain Onset More than a month ago   Pain Frequency Intermittent   Aggravating Factors  putting a shoe and sock on , walking   Pain Relieving Factors no movement   Multiple Pain Sites No                         [facility] Adult PT Treatment/Exercise  - 10/23/16 0001      Knee/Hip Exercises: Standing   Forward Step Up Left;Hand Hold: 2;10 reps;3 sets   Forward Step Up Limitations VC on hip extension   Other Standing Knee Exercises sit to stand with focus on extending left hip and knee in standing and flexion left hip going into sitting     Knee/Hip Exercises: Seated   Long Arc Quad Left;Strengthening;2 sets;10 reps;Weights   Long Arc Quad Weight 1 lbs.   Long [employer] Limitations able to get full extension for first time     Knee/Hip Exercises: Supine   Quad Sets Strengthening;Left;1 set;15 reps   [employer] Limitations needs assistance to fully straighten left knee   Bridges Both;Strengthening;1 set;20 reps;Limitations   Other Supine Knee/Hip Exercises left hip AAROM for hip abduction 20 times with endrange stretch   Other Supine Knee/Hip Exercises left hip ER with contract relax in different hip flexion angles     Manual Therapy   Manual Therapy Joint mobilization;Soft tissue mobilization   Manual therapy comments Prom to left hip with contract relax to increase ROM   Joint Mobilization joint mobilization to left knee for anterior and posterior glide  PT Education - 10/23/16 1219    Education provided No          PT Short Term Goals - 10/21/16 1156      PT SHORT TERM GOAL #1   Title independent with initial HEP   Time 4   Period Weeks   Status On-going     PT SHORT TERM GOAL #2   Title able to put her shoes and socks on with moderate difficulty due to increased left hip ROM   Time 4   Period Weeks   Status On-going     PT SHORT TERM GOAL #3   Title ability to roll in bed with moderate pain due to decreased tissue tightness   Time 4   Period Weeks   Status On-going     PT SHORT TERM GOAL #4   Title hip flexion >/= 100 degrees making it easier to get in and out of a chair   Time 4   Period Weeks   Status On-going           PT Long Term Goals - 10/15/16 1206      PT LONG TERM  GOAL #1   Title independent with HEP   Time 8   Period Weeks   Status New     PT LONG TERM GOAL #2   Title roll in bed with minimal to no pain due to decreased trigger points in left hip   Time 8   Period Weeks   Status New     PT LONG TERM GOAL #3   Title ability to put her shoes and socks on left hip with minimal difficulty due to increased left hip ER >/= 50 degress    Time 8   Period Weeks   Status New     PT LONG TERM GOAL #4   Title sit to stand with minimal difficulty due to increased strength in left hip and bil. knees   Time 8   Period Weeks   Status New     PT LONG TERM GOAL #5   Title FOTO score </= 43% limitation   Time 8   Period Weeks     Additional Long Term Goals   Additional Long Term Goals Yes     PT LONG TERM GOAL #6   Title ambulate with no pain due to increased stride length and increased left hip extension AROM >/= 5 degrees   Time 8   Period Weeks   Status New               Plan - 10/23/16 1219    Clinical Impression Statement Pain in left hip decreased to 3/10 after treatment. Patient hsa full knee extension after therapy.  Patient has difficulty with full hip extension and hip flexion going up and down from a chair.  Patient has pain at endrange stretch for left hip but contract relax helps with ROM.  Patient is now able to lay on left side without pain. Patient will benefit from skilled therapy to improve mobility and strength while reducing pain.    Rehab Potential Excellent   Clinical Impairments Affecting Rehab Potential None   PT Frequency 2x / week   PT Duration 8 weeks   PT Treatment/Interventions Cryotherapy;Electrical Stimulation;Iontophoresis 4mg /ml Dexamethasone;Stair training;Gait training;Ultrasound;Moist Heat;Therapeutic activities;Therapeutic exercise;Neuromuscular re-education;Patient/family education;Passive range of motion;Manual techniques;Dry needling;Taping   PT Next Visit Plan joint mobilization to left hip and knee;  strength of left hip and knee; soft tissue work of  left hip; gait training; modalities as needed   PT Home Exercise Plan progress as needed   Recommended Other Services none   Consulted and Agree with Plan of Care Patient      Patient will benefit from skilled therapeutic intervention in order to improve the following deficits and impairments:  Abnormal gait, Difficulty walking, Pain, Decreased strength, Decreased mobility, Decreased activity tolerance, Impaired flexibility, Increased muscle spasms, Decreased range of motion  Visit Diagnosis: Other abnormalities of gait and mobility  Muscle weakness (generalized)  Stiffness of left knee, not elsewhere classified  Stiffness of left hip, not elsewhere classified  Pain in left hip     Problem List Patient Active Problem List   Diagnosis Date Noted  . Varicose veins of lower extremities with other complications [id number]  . Pain in joint, lower leg 08/09/2013    Earlie Counts, PT 10/23/16 12:26 PM   [facility] [address], [place], [place], [address] Phone: [phone]   Fax:  [phone]  Name: Cindy Vaughn MRN: [id number] Date of Birth: 1946-08-18

## 2016-10-28 ENCOUNTER — Encounter: Payer: Self-pay | Admitting: Physical Therapy

## 2016-10-28 ENCOUNTER — Ambulatory Visit: Payer: Medicare Other | Admitting: Physical Therapy

## 2016-10-28 DIAGNOSIS — M25662 Stiffness of left knee, not elsewhere classified: Secondary | ICD-10-CM | POA: Diagnosis not present

## 2016-10-28 DIAGNOSIS — M25552 Pain in left hip: Secondary | ICD-10-CM | POA: Diagnosis not present

## 2016-10-28 DIAGNOSIS — M6281 Muscle weakness (generalized): Secondary | ICD-10-CM

## 2016-10-28 DIAGNOSIS — M25652 Stiffness of left hip, not elsewhere classified: Secondary | ICD-10-CM

## 2016-10-28 DIAGNOSIS — R2689 Other abnormalities of gait and mobility: Secondary | ICD-10-CM | POA: Diagnosis not present

## 2016-10-28 NOTE — Therapy (Signed)
[facility] [address], [place], [place], [address] Phone: [phone]   Fax:  [phone]  Physical Therapy Treatment  Patient Details  Name: Cindy Vaughn MRN: [id number] Date of Birth: [birth year] Referring Provider: Dr. Latanya Maudlin  Encounter Date: 10/28/2016      PT End of Session - 10/28/16 1200    Visit Number 4   Number of Visits 10   Date for PT Re-Evaluation 12/10/16   Authorization Type medicare 10th visit g-code   PT Start Time 1158  patient came late   PT Stop Time 1230   PT Time Calculation (min) 32 min   Activity Tolerance Patient tolerated treatment well   Behavior During Therapy [facility] for tasks assessed/performed      Past Medical History:  Diagnosis Date  . Leg pain   . Leg swelling   . Varicose veins     Past Surgical History:  Procedure Laterality Date  . ARTHROSCOPY KNEE W/ DRILLING Right 1995  . CESAREAN SECTION  1986  . CHOLECYSTECTOMY    . ENDOVENOUS ABLATION SAPHENOUS VEIN W/ LASER Right 12-20-2012   right Small saphenous vein at [place]  . ENDOVENOUS ABLATION SAPHENOUS VEIN W/ LASER Right 03-23-2013   right greater saphenous vein at [facility]    There were no vitals filed for this visit.      Subjective Assessment - 10/28/16 1201    Subjective I have not tried to put a shoe on.  I feel like my pelvis is aligned with my body and I am not walking sideways.  I have not pain in hip with walking.    Patient Stated Goals put shoe and sock on left foot wihout an issue   Currently in Pain? No/denies            [facility] PT Assessment - 10/28/16 0001      PROM   Left Hip Flexion 100                     [facility] Adult PT Treatment/Exercise - 10/28/16 0001      Knee/Hip Exercises: Standing   Terminal Knee Extension Strengthening;Left;1 set;15 reps  vc to contract gluteals and straighten knee   Theraband Level (Terminal Knee Extension) Level 3 (Green)    Lateral Step Up Left;1 set;20 reps;Hand Hold: 1   Lateral Step Up Limitations Vc to not leg left knee turn in   Forward Step Up Left;Hand Hold: 2;10 reps;3 sets   Forward Step Up Limitations VC on hip extension     Knee/Hip Exercises: Seated   Long Arc Quad Left;Strengthening;2 sets;10 reps;Weights   Long Arc Quad Weight 2 lbs.   Long [employer] Limitations able to get full extension for first time     Knee/Hip Exercises: Supine   Other Supine Knee/Hip Exercises hip flexor stretch with therapist guiding the stretch o f the msucle and keeping pelvis posteriorly rotated.    Other Supine Knee/Hip Exercises left hip ER with contract relax in different hip flexion angles; supine lift left leg up and over right leg and back                PT Education - 10/28/16 1228    Education provided No          PT Short Term Goals - 10/28/16 1203      PT SHORT TERM GOAL #1   Title independent with initial HEP   Time 4  Period Weeks   Status Achieved     PT SHORT TERM GOAL #2   Title able to put her shoes and socks on with moderate difficulty due to increased left hip ROM   Time 4   Period Weeks   Status On-going  has not tried yet     PT SHORT TERM GOAL #3   Title ability to roll in bed with moderate pain due to decreased tissue tightness   Time 4   Period Weeks   Status Achieved     PT SHORT TERM GOAL #4   Title hip flexion >/= 100 degrees making it easier to get in and out of a chair   Time 4   Period Weeks   Status Achieved           PT Long Term Goals - 10/15/16 1206      PT LONG TERM GOAL #1   Title independent with HEP   Time 8   Period Weeks   Status New     PT LONG TERM GOAL #2   Title roll in bed with minimal to no pain due to decreased trigger points in left hip   Time 8   Period Weeks   Status New     PT LONG TERM GOAL #3   Title ability to put her shoes and socks on left hip with minimal difficulty due to increased left hip ER >/= 50 degress     Time 8   Period Weeks   Status New     PT LONG TERM GOAL #4   Title sit to stand with minimal difficulty due to increased strength in left hip and bil. knees   Time 8   Period Weeks   Status New     PT LONG TERM GOAL #5   Title FOTO score </= 43% limitation   Time 8   Period Weeks     Additional Long Term Goals   Additional Long Term Goals Yes     PT LONG TERM GOAL #6   Title ambulate with no pain due to increased stride length and increased left hip extension AROM >/= 5 degrees   Time 8   Period Weeks   Status New               Plan - 10/28/16 1228    Clinical Impression Statement Patient is able to walk without pain and feels her hips are balanced and she is not leaning to the left.  Patient has not tried to put shoes on.  Patient has increased left hip flexion to 100 degrees.  Patient continues to have difficulty with left hip extension due to tightness.  After therapy patient had decreased tightness and ability to ambulate with increased left hip extension.    Rehab Potential Excellent   Clinical Impairments Affecting Rehab Potential None   PT Frequency 2x / week   PT Duration 8 weeks   PT Treatment/Interventions Cryotherapy;Electrical Stimulation;Iontophoresis 4mg /ml Dexamethasone;Stair training;Gait training;Ultrasound;Moist Heat;Therapeutic activities;Therapeutic exercise;Neuromuscular re-education;Patient/family education;Passive range of motion;Manual techniques;Dry needling;Taping   PT Next Visit Plan joint mobilization to left hip and knee; strength of left hip and knee; soft tissue work of left hip; gait training; modalities as needed   PT Home Exercise Plan progress as needed   Consulted and Agree with Plan of Care Patient      Patient will benefit from skilled therapeutic intervention in order to improve the following deficits and impairments:  Abnormal gait, Difficulty walking, Pain,  Decreased strength, Decreased mobility, Decreased activity tolerance,  Impaired flexibility, Increased muscle spasms, Decreased range of motion  Visit Diagnosis: Other abnormalities of gait and mobility  Muscle weakness (generalized)  Stiffness of left knee, not elsewhere classified  Stiffness of left hip, not elsewhere classified  Pain in left hip     Problem List Patient Active Problem List   Diagnosis Date Noted  . Varicose veins of lower extremities with other complications 123XX123  . Pain in joint, lower leg 08/09/2013    Earlie Counts, PT 10/28/16 12:31 PM   [facility] [address], [place], [place], [address] Phone: [phone]   Fax:  [phone]  Name: SHELBA ROSO MRN: [id number] Date of Birth: 1946/03/26

## 2016-10-29 ENCOUNTER — Encounter: Payer: Self-pay | Admitting: Physical Therapy

## 2016-10-29 ENCOUNTER — Ambulatory Visit: Payer: Medicare Other | Admitting: Physical Therapy

## 2016-10-29 ENCOUNTER — Telehealth: Payer: Self-pay | Admitting: Physical Therapy

## 2016-10-29 DIAGNOSIS — R2689 Other abnormalities of gait and mobility: Secondary | ICD-10-CM | POA: Diagnosis not present

## 2016-10-29 DIAGNOSIS — M6281 Muscle weakness (generalized): Secondary | ICD-10-CM

## 2016-10-29 DIAGNOSIS — M25552 Pain in left hip: Secondary | ICD-10-CM

## 2016-10-29 DIAGNOSIS — M25662 Stiffness of left knee, not elsewhere classified: Secondary | ICD-10-CM | POA: Diagnosis not present

## 2016-10-29 DIAGNOSIS — M25652 Stiffness of left hip, not elsewhere classified: Secondary | ICD-10-CM

## 2016-10-29 NOTE — Telephone Encounter (Signed)
Called patient and rescheduled to 4:15 today.

## 2016-10-29 NOTE — Therapy (Signed)
[facility] [address] [place], [place], [address] Phone: [phone]   Fax:  [phone]  Physical Therapy Treatment  Patient Details  Name: Cindy Vaughn MRN: [id number] Date of Birth: Feb 22, 1946 Referring Provider: Dr. Latanya Maudlin  Encounter Date: 10/29/2016      PT End of Session - 10/29/16 1645    Visit Number 5   Number of Visits 10   Date for PT Re-Evaluation 12/10/16   Authorization Type medicare 10th visit g-code   PT Start Time 1615   PT Stop Time 1655   PT Time Calculation (min) 40 min   Activity Tolerance Patient tolerated treatment well   Behavior During Therapy [facility] for tasks assessed/performed      Past Medical History:  Diagnosis Date  . Leg pain   . Leg swelling   . Varicose veins     Past Surgical History:  Procedure Laterality Date  . ARTHROSCOPY KNEE W/ DRILLING Right 1995  . CESAREAN SECTION  1986  . CHOLECYSTECTOMY    . ENDOVENOUS ABLATION SAPHENOUS VEIN W/ LASER Right 12-20-2012   right Small saphenous vein at [place]  . ENDOVENOUS ABLATION SAPHENOUS VEIN W/ LASER Right 03-23-2013   right greater saphenous vein at [facility]    There were no vitals filed for this visit.      Subjective Assessment - 10/29/16 1625    Subjective I am a little stiff today. I have been sitting some more to do my taxes.    Patient Stated Goals put shoe and sock on left foot wihout an issue   Currently in Pain? No/denies                         [facility] Adult PT Treatment/Exercise - 10/29/16 0001      Knee/Hip Exercises: Stretches   Hip Flexor Stretch Left;3 reps;30 seconds  with right foot on higher step leaning forward   Gastroc Stretch 2 reps;30 seconds;Left  on slanted black board     Knee/Hip Exercises: Aerobic   Nustep level 1 5 min; arm #11, seat #8 just using left     Knee/Hip Exercises: Standing   Lateral Step Up Left;1 set;20 reps;Hand Hold: 1   Lateral Step Up Limitations Vc to not leg left knee turn in   Forward Step Up Left;Hand Hold: 2;10 reps;3 sets   Forward Step Up Limitations VC on hip extension   Other Standing Knee Exercises side step 8 feet 4 times      Knee/Hip Exercises: Supine   Quad Sets Strengthening;Left;1 set;15 reps   Quad Sets Limitations need assistance to extend left knee   Bridges Both;Strengthening;1 set;10 reps   [employer] Limitations working on hip extension   Other Supine Knee/Hip Exercises hip flexor stretch with therapist guiding the stretch o f the msucle and keeping pelvis posteriorly rotated. ; AAROM for left hip abduction; PnF AAROM to left hip    Other Supine Knee/Hip Exercises left hip ER with contract relax in different hip flexion angles; supine lift left leg up and over right leg and back                PT Education - 10/29/16 1656    Education provided No          PT Short Term Goals - 10/28/16 1203      PT SHORT TERM GOAL #1   Title independent with initial HEP   Time 4  Period Weeks   Status Achieved     PT SHORT TERM GOAL #2   Title able to put her shoes and socks on with moderate difficulty due to increased left hip ROM   Time 4   Period Weeks   Status On-going  has not tried yet     PT SHORT TERM GOAL #3   Title ability to roll in bed with moderate pain due to decreased tissue tightness   Time 4   Period Weeks   Status Achieved     PT SHORT TERM GOAL #4   Title hip flexion >/= 100 degrees making it easier to get in and out of a chair   Time 4   Period Weeks   Status Achieved           PT Long Term Goals - 10/15/16 1206      PT LONG TERM GOAL #1   Title independent with HEP   Time 8   Period Weeks   Status New     PT LONG TERM GOAL #2   Title roll in bed with minimal to no pain due to decreased trigger points in left hip   Time 8   Period Weeks   Status New     PT LONG TERM GOAL #3   Title ability to put her shoes and socks on left hip with  minimal difficulty due to increased left hip ER >/= 50 degress    Time 8   Period Weeks   Status New     PT LONG TERM GOAL #4   Title sit to stand with minimal difficulty due to increased strength in left hip and bil. knees   Time 8   Period Weeks   Status New     PT LONG TERM GOAL #5   Title FOTO score </= 43% limitation   Time 8   Period Weeks     Additional Long Term Goals   Additional Long Term Goals Yes     PT LONG TERM GOAL #6   Title ambulate with no pain due to increased stride length and increased left hip extension AROM >/= 5 degrees   Time 8   Period Weeks   Status New               Plan - 10/29/16 1656    Clinical Impression Statement Patient is able to go further with her left hip motion.  Patient able to go up a step with increased left hip extension and knee extension.  Patient had less stiffness in left hip after therapy.  Patient will benefit from skilled therapy to improve left hip and knee strength and ROm to restore function.    Rehab Potential Excellent   Clinical Impairments Affecting Rehab Potential None   PT Frequency 2x / week   PT Duration 8 weeks   PT Treatment/Interventions Cryotherapy;Electrical Stimulation;Iontophoresis 4mg /ml Dexamethasone;Stair training;Gait training;Ultrasound;Moist Heat;Therapeutic activities;Therapeutic exercise;Neuromuscular re-education;Patient/family education;Passive range of motion;Manual techniques;Dry needling;Taping   PT Next Visit Plan joint mobilization to left hip and knee; strength of left hip and knee; soft tissue work of left hip; gait training; modalities as needed   PT Home Exercise Plan progress as needed   Consulted and Agree with Plan of Care Patient      Patient will benefit from skilled therapeutic intervention in order to improve the following deficits and impairments:  Abnormal gait, Difficulty walking, Pain, Decreased strength, Decreased mobility, Decreased activity tolerance, Impaired  flexibility, Increased muscle spasms,  Decreased range of motion  Visit Diagnosis: Other abnormalities of gait and mobility  Muscle weakness (generalized)  Stiffness of left knee, not elsewhere classified  Pain in left hip  Stiffness of left hip, not elsewhere classified     Problem List Patient Active Problem List   Diagnosis Date Noted  . Varicose veins of lower extremities with other complications 123XX123  . Pain in joint, lower leg 08/09/2013    Earlie Counts, PT 10/29/16 4:59 PM   [facility] [address] [place], [place], [address] Phone: [phone]   Fax:  [phone]  Name: Cindy Vaughn MRN: [id number] Date of Birth: [birth year]

## 2016-11-04 ENCOUNTER — Encounter: Payer: Self-pay | Admitting: Physical Therapy

## 2016-11-05 ENCOUNTER — Ambulatory Visit: Payer: Medicare Other | Attending: Orthopedic Surgery | Admitting: Physical Therapy

## 2016-11-05 ENCOUNTER — Encounter: Payer: Self-pay | Admitting: Physical Therapy

## 2016-11-05 DIAGNOSIS — M25652 Stiffness of left hip, not elsewhere classified: Secondary | ICD-10-CM

## 2016-11-05 DIAGNOSIS — M25662 Stiffness of left knee, not elsewhere classified: Secondary | ICD-10-CM | POA: Diagnosis not present

## 2016-11-05 DIAGNOSIS — M25552 Pain in left hip: Secondary | ICD-10-CM | POA: Diagnosis not present

## 2016-11-05 DIAGNOSIS — M6281 Muscle weakness (generalized): Secondary | ICD-10-CM | POA: Insufficient documentation

## 2016-11-05 DIAGNOSIS — R2689 Other abnormalities of gait and mobility: Secondary | ICD-10-CM | POA: Insufficient documentation

## 2016-11-05 NOTE — Therapy (Signed)
[facility] [address] [place], [place], [address] Phone: [phone]   Fax:  [phone]  Physical Therapy Treatment  Patient Details  Name: Cindy Vaughn MRN: [id number] Date of Birth: 18-Apr-1946 Referring Provider: Dr. Latanya Maudlin  Encounter Date: 11/05/2016      PT End of Session - 11/05/16 1222    Visit Number 6   Number of Visits 10   Date for PT Re-Evaluation 12/10/16   Authorization Type medicare 10th visit g-code   PT Start Time 7893  came late   PT Stop Time 1231   PT Time Calculation (min) 38 min   Activity Tolerance Patient tolerated treatment well   Behavior During Therapy [facility] for tasks assessed/performed      Past Medical History:  Diagnosis Date  . Leg pain   . Leg swelling   . Varicose veins     Past Surgical History:  Procedure Laterality Date  . ARTHROSCOPY KNEE W/ DRILLING Right 1995  . CESAREAN SECTION  1986  . CHOLECYSTECTOMY    . ENDOVENOUS ABLATION SAPHENOUS VEIN W/ LASER Right 12-20-2012   right Small saphenous vein at [place]  . ENDOVENOUS ABLATION SAPHENOUS VEIN W/ LASER Right 03-23-2013   right greater saphenous vein at [facility]    There were no vitals filed for this visit.      Subjective Assessment - 11/05/16 1156    Subjective My left hip pain is 50% better. I have been stretching my leg more.     Patient Stated Goals put shoe and sock on left foot wihout an issue   Currently in Pain? No/denies                         [facility] Adult PT Treatment/Exercise - 11/05/16 0001      Knee/Hip Exercises: Aerobic   Nustep level 1 5 min; arm #11, seat #8      Knee/Hip Exercises: Supine   Quad Sets Strengthening;Left;1 set;15 reps   Quad Sets Limitations need assistance to extens left knee and stabilize the left pelvis   Bridges Both;Strengthening;1 set;10 reps   Bridges Limitations working on hip extension   Other Supine Knee/Hip Exercises  hip flexor stretch with therapist guiding the stretch o f the msucle and keeping pelvis posteriorly rotated. ; AAROM for left hip abduction; PnF AAROM to left hip    Other Supine Knee/Hip Exercises left hip ER with contract relax in different hip flexion angles; supine lift left leg up and over right leg and back     Manual Therapy   Manual Therapy Soft tissue mobilization   Soft tissue mobilization left iliotibial band, left hamstring, post. left knee, left hip flexor                PT Education - 11/05/16 1222    Education provided No          PT Short Term Goals - 11/05/16 1223      PT SHORT TERM GOAL #1   Title independent with initial HEP   Time 4   Period Weeks   Status Achieved     PT SHORT TERM GOAL #2   Title able to put her shoes and socks on with moderate difficulty due to increased left hip ROM   Time 4   Period Weeks   Status Achieved     PT SHORT TERM GOAL #3   Title ability to roll in bed  with moderate pain due to decreased tissue tightness   Time 4   Period Weeks   Status Achieved     PT SHORT TERM GOAL #4   Title hip flexion >/= 100 degrees making it easier to get in and out of a chair   Time 4   Period Weeks   Status Achieved           PT Long Term Goals - 11/05/16 1157      PT LONG TERM GOAL #1   Title independent with HEP   Time 8   Period Weeks   Status On-going     PT LONG TERM GOAL #2   Title roll in bed with minimal to no pain due to decreased trigger points in left hip   Status On-going  minimal pain      PT LONG TERM GOAL #3   Title ability to put her shoes and socks on left hip with minimal difficulty due to increased left hip ER >/= 50 degress    Time 8   Period Weeks   Status On-going  moderate difficulty     PT LONG TERM GOAL #4   Title sit to stand with minimal difficulty due to increased strength in left hip and bil. knees   Time 8   Period Weeks   Status On-going  50% easier     PT LONG TERM GOAL #6    Title ambulate with no pain due to increased stride length and increased left hip extension AROM >/= 5 degrees   Time 8   Period Weeks   Status On-going  40% better               Plan - 11/05/16 1223    Clinical Impression Statement Patient reports her left hip pain is decreased by 50%.  She walks with 40% less pain.  Patient is able to place her left knee ontop of the right knee for first time.  Therapist is able to fully extend her left knee.  Patient has met all of her STG's.  Patient will benefit from skilled therapy to improve left  hip and knee strength and ROM to restore function.    Rehab Potential Excellent   Clinical Impairments Affecting Rehab Potential None   PT Frequency 2x / week   PT Duration 8 weeks   PT Treatment/Interventions Cryotherapy;Electrical Stimulation;Iontophoresis '4mg'$ /ml Dexamethasone;Stair training;Gait training;Ultrasound;Moist Heat;Therapeutic activities;Therapeutic exercise;Neuromuscular re-education;Patient/family education;Passive range of motion;Manual techniques;Dry needling;Taping   PT Next Visit Plan joint mobilization to left hip and knee; strength of left hip and knee; soft tissue work of left hip; gait training; modalities as needed   PT Home Exercise Plan progress as needed   Consulted and Agree with Plan of Care Patient      Patient will benefit from skilled therapeutic intervention in order to improve the following deficits and impairments:  Abnormal gait, Difficulty walking, Pain, Decreased strength, Decreased mobility, Decreased activity tolerance, Impaired flexibility, Increased muscle spasms, Decreased range of motion  Visit Diagnosis: Other abnormalities of gait and mobility  Muscle weakness (generalized)  Stiffness of left knee, not elsewhere classified  Pain in left hip  Stiffness of left hip, not elsewhere classified     Problem List Patient Active Problem List   Diagnosis Date Noted  . Varicose veins of lower  extremities with other complications 09/38/1829  . Pain in joint, lower leg 08/09/2013    Earlie Counts, PT 11/05/16 12:27 PM   [facility]  [place] [address], [place], [place], [address] Phone: [phone]   Fax:  [phone]  Name: Cindy Vaughn MRN: [id number] Date of Birth: Nov 24, 1945

## 2016-11-06 ENCOUNTER — Encounter: Payer: Self-pay | Admitting: Physical Therapy

## 2016-11-06 ENCOUNTER — Ambulatory Visit: Payer: Medicare Other | Admitting: Physical Therapy

## 2016-11-06 DIAGNOSIS — M6281 Muscle weakness (generalized): Secondary | ICD-10-CM

## 2016-11-06 DIAGNOSIS — M25552 Pain in left hip: Secondary | ICD-10-CM | POA: Diagnosis not present

## 2016-11-06 DIAGNOSIS — M25652 Stiffness of left hip, not elsewhere classified: Secondary | ICD-10-CM | POA: Diagnosis not present

## 2016-11-06 DIAGNOSIS — R2689 Other abnormalities of gait and mobility: Secondary | ICD-10-CM

## 2016-11-06 DIAGNOSIS — M25662 Stiffness of left knee, not elsewhere classified: Secondary | ICD-10-CM

## 2016-11-06 NOTE — Therapy (Signed)
[facility] [address], [place], [place], [address] Phone: [phone]   Fax:  [phone]  Physical Therapy Treatment  Patient Details  Name: Cindy Vaughn MRN: [id number] Date of Birth: [birth year] Referring Provider: Dr. Latanya Maudlin  Encounter Date: 11/06/2016      PT End of Session - 11/06/16 1152    Visit Number 7   Number of Visits 10   Date for PT Re-Evaluation 12/10/16   Authorization Type medicare 10th visit g-code   PT Start Time 7793   PT Stop Time 1225   PT Time Calculation (min) 40 min   Activity Tolerance Patient tolerated treatment well   Behavior During Therapy [facility] for tasks assessed/performed      Past Medical History:  Diagnosis Date  . Leg pain   . Leg swelling   . Varicose veins     Past Surgical History:  Procedure Laterality Date  . ARTHROSCOPY KNEE W/ DRILLING Right 1995  . CESAREAN SECTION  1986  . CHOLECYSTECTOMY    . ENDOVENOUS ABLATION SAPHENOUS VEIN W/ LASER Right 12-20-2012   right Small saphenous vein at [place]  . ENDOVENOUS ABLATION SAPHENOUS VEIN W/ LASER Right 03-23-2013   right greater saphenous vein at [facility]    There were no vitals filed for this visit.      Subjective Assessment - 11/06/16 1151    Subjective I feel a little sore today.    Patient Stated Goals put shoe and sock on left foot wihout an issue   Currently in Pain? Yes   Pain Score 2    Pain Location Hip   Pain Orientation Left   Pain Descriptors / Indicators Sore   Pain Type Chronic pain   Pain Radiating Towards none   Pain Onset More than a month ago   Pain Frequency Intermittent   Aggravating Factors  putting a shoe and sock on, walking   Pain Relieving Factors no movement   Multiple Pain Sites No                         [facility] Adult PT Treatment/Exercise - 11/06/16 0001      Knee/Hip Exercises: Stretches   Hip Flexor Stretch Left;3 reps;30 seconds   with right foot on higher step leaning forward   Gastroc Stretch 2 reps;Both;30 seconds  use slanted board     Knee/Hip Exercises: Aerobic   Nustep level 1 7 min; arm #11, seat #8   just left leg     Knee/Hip Exercises: Standing   Heel Raises Both;1 set;10 reps   Hip Extension Left;Stengthening;2 sets;10 reps  leaning on mat   Lateral Step Up Left;1 set;20 reps;Hand Hold: 1   Lateral Step Up Limitations Vc to not leg left knee turn in   Forward Step Up Left;Hand Hold: 2;10 reps;3 sets   Forward Step Up Limitations VC on hip extension     Knee/Hip Exercises: Supine   Quad Sets Strengthening;Left;1 set;15 reps   Quad Sets Limitations need assistance for endrange and stabilize pelvis   Bridges Both;Strengthening;1 set;10 reps   Bridges Limitations working on hip extension   Other Supine Knee/Hip Exercises left hip ER with contract relax in different hip flexion angles; supine lift left leg up and over right leg and back                PT Education - 11/06/16 1226    Education provided No  PT Short Term Goals - 11/05/16 1223      PT SHORT TERM GOAL #1   Title independent with initial HEP   Time 4   Period Weeks   Status Achieved     PT SHORT TERM GOAL #2   Title able to put her shoes and socks on with moderate difficulty due to increased left hip ROM   Time 4   Period Weeks   Status Achieved     PT SHORT TERM GOAL #3   Title ability to roll in bed with moderate pain due to decreased tissue tightness   Time 4   Period Weeks   Status Achieved     PT SHORT TERM GOAL #4   Title hip flexion >/= 100 degrees making it easier to get in and out of a chair   Time 4   Period Weeks   Status Achieved           PT Long Term Goals - 11/05/16 1157      PT LONG TERM GOAL #1   Title independent with HEP   Time 8   Period Weeks   Status On-going     PT LONG TERM GOAL #2   Title roll in bed with minimal to no pain due to decreased trigger points in  left hip   Status On-going  minimal pain      PT LONG TERM GOAL #3   Title ability to put her shoes and socks on left hip with minimal difficulty due to increased left hip ER >/= 50 degress    Time 8   Period Weeks   Status On-going  moderate difficulty     PT LONG TERM GOAL #4   Title sit to stand with minimal difficulty due to increased strength in left hip and bil. knees   Time 8   Period Weeks   Status On-going  50% easier     PT LONG TERM GOAL #6   Title ambulate with no pain due to increased stride length and increased left hip extension AROM >/= 5 degrees   Time 8   Period Weeks   Status On-going  40% better               Plan - 11/06/16 1226    Clinical Impression Statement Patient was tired today.  Patient is still able to place left knee on right knee in sitting.  Patient was sore from her workout from yesterday.  Patient has difficulty with using the right leg with new step due to pain. Patient was able to get her knee straighter after therapy. Patient will benefit from skilled therapy to improve left hip and knee strength and ROM to restore function.    Rehab Potential Excellent   Clinical Impairments Affecting Rehab Potential None   PT Frequency 2x / week   PT Duration 8 weeks   PT Treatment/Interventions Cryotherapy;Electrical Stimulation;Iontophoresis 4mg /ml Dexamethasone;Stair training;Gait training;Ultrasound;Moist Heat;Therapeutic activities;Therapeutic exercise;Neuromuscular re-education;Patient/family education;Passive range of motion;Manual techniques;Dry needling;Taping   PT Next Visit Plan work on knee and hip extension; work on hip ROM   PT Home Exercise Plan progress as needed   Consulted and Agree with Plan of Care Patient      Patient will benefit from skilled therapeutic intervention in order to improve the following deficits and impairments:  Abnormal gait, Difficulty walking, Pain, Decreased strength, Decreased mobility, Decreased activity  tolerance, Impaired flexibility, Increased muscle spasms, Decreased range of motion  Visit Diagnosis: Other abnormalities of  gait and mobility  Muscle weakness (generalized)  Stiffness of left knee, not elsewhere classified  Pain in left hip  Stiffness of left hip, not elsewhere classified     Problem List Patient Active Problem List   Diagnosis Date Noted  . Varicose veins of lower extremities with other complications 30/03/6225  . Pain in joint, lower leg 08/09/2013    Earlie Counts, PT 11/06/16 12:31 PM   [facility] [address] [place], [place], [address] Phone: [phone]   Fax:  [phone]  Name: RYANA MONTECALVO MRN: [id number] Date of Birth: Mar 31, 1946

## 2016-11-11 ENCOUNTER — Encounter: Payer: Self-pay | Admitting: Physical Therapy

## 2016-11-12 ENCOUNTER — Ambulatory Visit: Payer: Medicare Other | Admitting: Physical Therapy

## 2016-11-12 ENCOUNTER — Encounter: Payer: Self-pay | Admitting: Physical Therapy

## 2016-11-12 DIAGNOSIS — M25652 Stiffness of left hip, not elsewhere classified: Secondary | ICD-10-CM | POA: Diagnosis not present

## 2016-11-12 DIAGNOSIS — R2689 Other abnormalities of gait and mobility: Secondary | ICD-10-CM

## 2016-11-12 DIAGNOSIS — M25662 Stiffness of left knee, not elsewhere classified: Secondary | ICD-10-CM

## 2016-11-12 DIAGNOSIS — M6281 Muscle weakness (generalized): Secondary | ICD-10-CM | POA: Diagnosis not present

## 2016-11-12 DIAGNOSIS — M25552 Pain in left hip: Secondary | ICD-10-CM | POA: Diagnosis not present

## 2016-11-12 NOTE — Patient Instructions (Signed)

## 2016-11-12 NOTE — Therapy (Signed)
[facility] [address] [place], [place], [address] Phone: [phone]   Fax:  [phone]  Physical Therapy Treatment  Patient Details  Name: Cindy Vaughn MRN: [id number] Date of Birth: Dec 08, 1945 Referring Provider: Dr. Latanya Maudlin  Encounter Date: 11/12/2016      PT End of Session - 11/12/16 1244    Visit Number 8   Number of Visits 10   Date for PT Re-Evaluation 12/10/16   Authorization Type medicare 10th visit g-code   PT Start Time 1150   PT Stop Time 1233   PT Time Calculation (min) 43 min   Activity Tolerance Patient tolerated treatment well;Patient limited by pain   Behavior During Therapy [facility] for tasks assessed/performed      Past Medical History:  Diagnosis Date  . Leg pain   . Leg swelling   . Varicose veins     Past Surgical History:  Procedure Laterality Date  . ARTHROSCOPY KNEE W/ DRILLING Right 1995  . CESAREAN SECTION  1986  . CHOLECYSTECTOMY    . ENDOVENOUS ABLATION SAPHENOUS VEIN W/ LASER Right 12-20-2012   right Small saphenous vein at [place]  . ENDOVENOUS ABLATION SAPHENOUS VEIN W/ LASER Right 03-23-2013   right greater saphenous vein at [facility]    There were no vitals filed for this visit.      Subjective Assessment - 11/12/16 1156    Subjective I have been in alot of pain since last visit and I have not been able to exercise.  I hope I have not lost any gains I made in therapy.    Patient Stated Goals put shoe and sock on left foot wihout an issue   Currently in Pain? Yes   Pain Score 3    Pain Location Hip   Pain Orientation Left   Pain Descriptors / Indicators Sore   Pain Type Chronic pain   Pain Onset More than a month ago   Pain Frequency Constant   Aggravating Factors  putting a shoe and sock on , walking   Pain Relieving Factors no movement   Multiple Pain Sites No            [facility] PT Assessment - 11/12/16 0001      PROM   Left Hip  Extension 0   Left Hip Flexion 90   Left Hip External Rotation  30   Left Hip Internal Rotation  10   Left Hip ABduction 14                     [facility] Adult PT Treatment/Exercise - 11/12/16 0001      Modalities   Modalities Ultrasound;Iontophoresis     Ultrasound   Ultrasound Location posterior left hip  right sidely   Ultrasound Parameters 100%, 1 mhz, 1.2 w/cm2 8 min   Ultrasound Goals Pain     Iontophoresis   Type of Iontophoresis Dexamethasone   Location posterior left hip   Dose 53m  #1   Time 6 hour     Manual Therapy   Manual Therapy Soft tissue mobilization   Soft tissue mobilization left piriformis, left hip rotators, left gluteus medius, upper portion of left iliotibial band, Posterior left hip and left SI joint in right sidely                PT Education - 11/12/16 1244    Education provided Yes   Education Details information on iontophoresis   Person(s)  Educated Patient   Methods Explanation;Demonstration;Verbal cues;Handout   Comprehension Returned demonstration;Verbalized understanding          PT Short Term Goals - 11/05/16 1223      PT SHORT TERM GOAL #1   Title independent with initial HEP   Time 4   Period Weeks   Status Achieved     PT SHORT TERM GOAL #2   Title able to put her shoes and socks on with moderate difficulty due to increased left hip ROM   Time 4   Period Weeks   Status Achieved     PT SHORT TERM GOAL #3   Title ability to roll in bed with moderate pain due to decreased tissue tightness   Time 4   Period Weeks   Status Achieved     PT SHORT TERM GOAL #4   Title hip flexion >/= 100 degrees making it easier to get in and out of a chair   Time 4   Period Weeks   Status Achieved           PT Long Term Goals - 11/12/16 1248      PT LONG TERM GOAL #1   Title independent with HEP   Time 8   Period Weeks   Status On-going     PT LONG TERM GOAL #2   Title roll in bed with minimal to no pain  due to decreased trigger points in left hip   Time 8   Period Weeks   Status On-going  flare-up this week     PT LONG TERM GOAL #3   Title ability to put her shoes and socks on left hip with minimal difficulty due to increased left hip ER >/= 50 degress    Time 8   Status On-going  flare-up this week     PT LONG TERM GOAL #4   Title sit to stand with minimal difficulty due to increased strength in left hip and bil. knees   Time 8   Period Weeks   Status On-going     PT LONG TERM GOAL #6   Title ambulate with no pain due to increased stride length and increased left hip extension AROM >/= 5 degrees   Time 8   Period Weeks   Status On-going               Plan - 11/12/16 1245    Clinical Impression Statement Patient had increased pain from last visit and was not able to exercise due to the pain.  Patient had increased pain with palpation on posterior left hip.  After therapy pain decreased by 50%. Patient had iontophoresis treatment today due to her flare-up.  Patient has not met goals due to her recent flare-up.  Patient has increased PROM of left hip.  Patient will benefit from skilled therapy to reduce left hip pain and improve PROM.    Rehab Potential Excellent   Clinical Impairments Affecting Rehab Potential None   PT Frequency 2x / week   PT Duration 8 weeks   PT Treatment/Interventions Cryotherapy;Electrical Stimulation;Iontophoresis 37m/ml Dexamethasone;Stair training;Gait training;Ultrasound;Moist Heat;Therapeutic activities;Therapeutic exercise;Neuromuscular re-education;Patient/family education;Passive range of motion;Manual techniques;Dry needling;Taping   PT Next Visit Plan work on knee and hip extension; work on hip ROM; ionto #2   PT Home Exercise Plan progress as needed   Consulted and Agree with Plan of Care Patient      Patient will benefit from skilled therapeutic intervention in order to improve the following deficits  and impairments:  Abnormal gait,  Difficulty walking, Pain, Decreased strength, Decreased mobility, Decreased activity tolerance, Impaired flexibility, Increased muscle spasms, Decreased range of motion  Visit Diagnosis: Other abnormalities of gait and mobility  Stiffness of left knee, not elsewhere classified  Pain in left hip  Stiffness of left hip, not elsewhere classified     Problem List Patient Active Problem List   Diagnosis Date Noted  . Varicose veins of lower extremities with other complications 82/04/1387  . Pain in joint, lower leg 08/09/2013    Earlie Counts, PT 11/12/16 12:50 PM   [facility] [address], [place], [place], [address] Phone: [phone]   Fax:  [phone]  Name: Cindy Vaughn MRN: [id number] Date of Birth: [birth year]

## 2016-11-13 ENCOUNTER — Ambulatory Visit (HOSPITAL_COMMUNITY)
Admission: RE | Admit: 2016-11-13 | Discharge: 2016-11-13 | Disposition: A | Discharge status: Home / Self Care | Payer: Medicare Other | Source: Ambulatory Visit | Attending: Oral and Maxillofacial Surgery | Admitting: Oral and Maxillofacial Surgery

## 2016-11-13 DIAGNOSIS — R221 Localized swelling, mass and lump, neck: Secondary | ICD-10-CM | POA: Diagnosis not present

## 2016-11-13 DIAGNOSIS — R220 Localized swelling, mass and lump, head: Secondary | ICD-10-CM | POA: Diagnosis present

## 2016-11-13 DIAGNOSIS — E042 Nontoxic multinodular goiter: Secondary | ICD-10-CM | POA: Diagnosis not present

## 2016-11-13 LAB — CREATININE, SERUM
Creatinine, Ser: 0.74 mg/dL (ref 0.44–1.00)
GFR calc non Af Amer: >60 mL/min (ref >60)

## 2016-11-13 MED ORDER — IOPAMIDOL (ISOVUE-300) INJECTION 61%
INTRAVENOUS | Status: AC
Start: 2016-11-13 — End: 2016-11-13
  Administered 2016-11-13: 75 mL
  Filled 2016-11-13: qty 75

## 2016-11-18 ENCOUNTER — Encounter: Payer: Self-pay | Admitting: Physical Therapy

## 2016-11-20 ENCOUNTER — Encounter: Payer: Self-pay | Admitting: Physical Therapy

## 2016-11-20 ENCOUNTER — Ambulatory Visit: Payer: Medicare Other | Admitting: Physical Therapy

## 2016-11-20 DIAGNOSIS — M25662 Stiffness of left knee, not elsewhere classified: Secondary | ICD-10-CM

## 2016-11-20 DIAGNOSIS — M6281 Muscle weakness (generalized): Secondary | ICD-10-CM

## 2016-11-20 DIAGNOSIS — M25652 Stiffness of left hip, not elsewhere classified: Secondary | ICD-10-CM | POA: Diagnosis not present

## 2016-11-20 DIAGNOSIS — R2689 Other abnormalities of gait and mobility: Secondary | ICD-10-CM | POA: Diagnosis not present

## 2016-11-20 DIAGNOSIS — M25552 Pain in left hip: Secondary | ICD-10-CM | POA: Diagnosis not present

## 2016-11-20 NOTE — Therapy (Signed)
[facility] [address], [place], [place], [address] Phone: [phone]   Fax:  [phone]  Physical Therapy Treatment  Patient Details  Name: Cindy Vaughn MRN: [id number] Date of Birth: [birth year] Referring Provider: Dr. Latanya Maudlin  Encounter Date: 11/20/2016      PT End of Session - 11/20/16 1229    Visit Number 9   Number of Visits 10   Date for PT Re-Evaluation 12/10/16   Authorization Type medicare 10th visit g-code   PT Start Time 1150   PT Stop Time 1229   PT Time Calculation (min) 39 min   Activity Tolerance Patient tolerated treatment well;Patient limited by pain   Behavior During Therapy [facility] for tasks assessed/performed      Past Medical History:  Diagnosis Date  . Leg pain   . Leg swelling   . Varicose veins     Past Surgical History:  Procedure Laterality Date  . ARTHROSCOPY KNEE W/ DRILLING Right 1995  . CESAREAN SECTION  1986  . CHOLECYSTECTOMY    . ENDOVENOUS ABLATION SAPHENOUS VEIN W/ LASER Right 12-20-2012   right Small saphenous vein at [place]  . ENDOVENOUS ABLATION SAPHENOUS VEIN W/ LASER Right 03-23-2013   right greater saphenous vein at [facility]    There were no vitals filed for this visit.      Subjective Assessment - 11/20/16 1152    Subjective The ionto helped my pain.    Patient Stated Goals put shoe and sock on left foot wihout an issue   Currently in Pain? Yes   Pain Score 3    Pain Location Hip   Pain Orientation Left   Pain Descriptors / Indicators Sore   Pain Type Chronic pain   Pain Onset More than a month ago   Pain Frequency Intermittent   Aggravating Factors  puttine a shoe and sock on, walking   Pain Relieving Factors no movement   Multiple Pain Sites No                         [facility] Adult PT Treatment/Exercise - 11/20/16 0001      Knee/Hip Exercises: Aerobic   Nustep level 1 7 min; arm #9, seat #6   just left leg      Knee/Hip Exercises: Standing   Hip Abduction Right;Left;2 sets;10 reps  VC to not move trunk   Hip Extension Stengthening;Right;Left;2 sets;10 reps;Knee straight  contract gluteals  and not move pelvis     Knee/Hip Exercises: Seated   Sit to Sand 10 reps;without UE support  control with sitting     Manual Therapy   Manual Therapy Passive ROM   Passive ROM left hip flexion and external rotation                PT Education - 11/20/16 1229    Education provided No          PT Short Term Goals - 11/05/16 1223      PT SHORT TERM GOAL #1   Title independent with initial HEP   Time 4   Period Weeks   Status Achieved     PT SHORT TERM GOAL #2   Title able to put her shoes and socks on with moderate difficulty due to increased left hip ROM   Time 4   Period Weeks   Status Achieved     PT SHORT TERM GOAL #3   Title ability  to roll in bed with moderate pain due to decreased tissue tightness   Time 4   Period Weeks   Status Achieved     PT SHORT TERM GOAL #4   Title hip flexion >/= 100 degrees making it easier to get in and out of a chair   Time 4   Period Weeks   Status Achieved           PT Long Term Goals - 11/20/16 1155      PT LONG TERM GOAL #1   Title independent with HEP   Time 8   Period Weeks   Status On-going     PT LONG TERM GOAL #2   Title roll in bed with minimal to no pain due to decreased trigger points in left hip   Time 8   Period Weeks   Status Achieved     PT LONG TERM GOAL #3   Title ability to put her shoes and socks on left hip with minimal difficulty due to increased left hip ER >/= 50 degress    Time 8   Period Weeks   Status On-going  not able to do     PT LONG TERM GOAL #4   Title sit to stand with minimal difficulty due to increased strength in left hip and bil. knees   Time 8   Period Weeks   Status On-going  still needs control on desent     PT LONG TERM GOAL #5   Title FOTO score </= 43% limitation    Period Weeks   Status New               Plan - 11/20/16 1229    Clinical Impression Statement Patient did not have the intense pain last visit and was able to exercise today.  Patient continues to have decreased mobility of left hip.  Patient has trouble with controlling from stand to sit and needs verbal cues.  Patient unable to put socks on due to limited left hip ROM. Patient does not have pain with rolling in bed.  Patient will benefit from skilled therapy to left hip pain and improve PROM.    Rehab Potential Excellent   Clinical Impairments Affecting Rehab Potential None   PT Frequency 2x / week   PT Duration 8 weeks   PT Treatment/Interventions Cryotherapy;Electrical Stimulation;Iontophoresis 4mg /ml Dexamethasone;Stair training;Gait training;Ultrasound;Moist Heat;Therapeutic activities;Therapeutic exercise;Neuromuscular re-education;Patient/family education;Passive range of motion;Manual techniques;Dry needling;Taping   PT Next Visit Plan work on knee and hip extension; work on hip ROM; ionto #2; take left hip measurements; Need g-code   PT Home Exercise Plan progress as needed   Consulted and Agree with Plan of Care Patient      Patient will benefit from skilled therapeutic intervention in order to improve the following deficits and impairments:  Abnormal gait, Difficulty walking, Pain, Decreased strength, Decreased mobility, Decreased activity tolerance, Impaired flexibility, Increased muscle spasms, Decreased range of motion  Visit Diagnosis: Other abnormalities of gait and mobility  Stiffness of left knee, not elsewhere classified  Pain in left hip  Stiffness of left hip, not elsewhere classified  Muscle weakness (generalized)     Problem List Patient Active Problem List   Diagnosis Date Noted  . Varicose veins of lower extremities with other complications 96/75/9163  . Pain in joint, lower leg 08/09/2013    Earlie Counts, PT 11/20/16 12:34 PM   [facility] [address], [place], [place], [address]  Phone: [phone]   Fax:  [phone]  Name: Cindy Vaughn MRN: [id number] Date of Birth: [birth year]

## 2016-11-25 ENCOUNTER — Ambulatory Visit: Payer: Medicare Other | Admitting: Physical Therapy

## 2016-11-25 ENCOUNTER — Encounter: Payer: Self-pay | Admitting: Physical Therapy

## 2016-11-25 DIAGNOSIS — M25662 Stiffness of left knee, not elsewhere classified: Secondary | ICD-10-CM

## 2016-11-25 DIAGNOSIS — R2689 Other abnormalities of gait and mobility: Secondary | ICD-10-CM | POA: Diagnosis not present

## 2016-11-25 DIAGNOSIS — M25652 Stiffness of left hip, not elsewhere classified: Secondary | ICD-10-CM

## 2016-11-25 DIAGNOSIS — M25552 Pain in left hip: Secondary | ICD-10-CM | POA: Diagnosis not present

## 2016-11-25 DIAGNOSIS — M6281 Muscle weakness (generalized): Secondary | ICD-10-CM

## 2016-11-25 NOTE — Therapy (Signed)
[facility] [address], [place], [place], [address] Phone: [phone]   Fax:  [phone]  Physical Therapy Treatment  Patient Details  Name: Cindy Vaughn MRN: [id number] Date of Birth: 02-Feb-1946 Referring Provider: Dr. Latanya Maudlin  Encounter Date: 11/25/2016      PT End of Session - 11/25/16 1221    Visit Number 10   Number of Visits 20   Date for PT Re-Evaluation 12/10/16   Authorization Type medicare 10th visit g-code   PT Start Time 3007   PT Stop Time 1225   PT Time Calculation (min) 40 min   Activity Tolerance Patient tolerated treatment well;Patient limited by pain   Behavior During Therapy [facility] for tasks assessed/performed      Past Medical History:  Diagnosis Date  . Leg pain   . Leg swelling   . Varicose veins     Past Surgical History:  Procedure Laterality Date  . ARTHROSCOPY KNEE W/ DRILLING Right 1995  . CESAREAN SECTION  1986  . CHOLECYSTECTOMY    . ENDOVENOUS ABLATION SAPHENOUS VEIN W/ LASER Right 12-20-2012   right Small saphenous vein at [place]  . ENDOVENOUS ABLATION SAPHENOUS VEIN W/ LASER Right 03-23-2013   right greater saphenous vein at [facility]    There were no vitals filed for this visit.      Subjective Assessment - 11/25/16 1207    Subjective My left hip pain decreased by 75% better. Not walking crooked any more. Still not able to put her shoes and socks on. No pain with walking.  I have pain with stretches.    Patient Stated Goals put shoe and sock on left foot wihout an issue   Currently in Pain? No/denies            [facility] PT Assessment - 11/25/16 0001      Assessment   Medical Diagnosis M16.12 Primary osteoarthritis of left hip; M25.552 left hip pain   Referring Provider Dr. Latanya Maudlin   Onset Date/Surgical Date 04/01/16   Prior Therapy None     Precautions   Precautions None     Restrictions   Weight Bearing Restrictions No     Home Environment   Living Environment Private residence     Prior Function   Level of Independence Independent     Cognition   Overall Cognitive Status Within Functional Limits for tasks assessed     Observation/Other Assessments   Focus on Therapeutic Outcomes (FOTO)  48% limitation     Posture/Postural Control   Posture/Postural Control No significant limitations     PROM   Left Hip Extension 0   Left Hip Flexion 100   Left Hip External Rotation  30   Left Hip Internal Rotation  15   Left Hip ABduction 18   Left Knee Extension -5     Strength   Left Hip Flexion 4/5   Left Hip Extension 4/5   Left Hip External Rotation 4/5   Left Hip Internal Rotation 4/5   Left Hip ABduction 4/5                     [facility] Adult PT Treatment/Exercise - 11/25/16 0001      Knee/Hip Exercises: Aerobic   Nustep level 1 8 min; arm #9, seat #6   just left leg     Knee/Hip Exercises: Standing   Hip Abduction Stengthening;Right;Left;2 sets;10 reps;Knee straight   Abduction Limitations tried 2# but  painful, 1#   Hip Extension Stengthening;Right;Left;2 sets;10 reps;Knee straight   Extension Limitations 2 #                PT Education - 11/25/16 1220    Education provided No          PT Short Term Goals - 11/05/16 1223      PT SHORT TERM GOAL #1   Title independent with initial HEP   Time 4   Period Weeks   Status Achieved     PT SHORT TERM GOAL #2   Title able to put her shoes and socks on with moderate difficulty due to increased left hip ROM   Time 4   Period Weeks   Status Achieved     PT SHORT TERM GOAL #3   Title ability to roll in bed with moderate pain due to decreased tissue tightness   Time 4   Period Weeks   Status Achieved     PT SHORT TERM GOAL #4   Title hip flexion >/= 100 degrees making it easier to get in and out of a chair   Time 4   Period Weeks   Status Achieved           PT Long Term Goals - 11/25/16 1209      PT LONG  TERM GOAL #2   Title roll in bed with minimal to no pain due to decreased trigger points in left hip   Time 8   Period Weeks   Status Achieved     PT LONG TERM GOAL #3   Title ability to put her shoes and socks on left hip with minimal difficulty due to increased left hip ER >/= 50 degress    Time 8   Period Weeks   Status On-going  not able to      PT LONG TERM GOAL #4   Title sit to stand with minimal difficulty due to increased strength in left hip and bil. knees   Time 8   Period Weeks   Status Achieved     PT LONG TERM GOAL #5   Title FOTO score </= 43% limitation   Time 8   Period Weeks   Status Not Met  48% limitation     PT LONG TERM GOAL #6   Title ambulate with no pain due to increased stride length and increased left hip extension AROM >/= 5 degrees   Time 8   Period Weeks   Status On-going  0 degrees               Plan - 11/25/16 1221    Clinical Impression Statement Patient has increased ROM and strength of left hip.  Patient still has difficulty with putting on a shoe and sock.  Therapist showed patient devices that will assist her. Pateint walks without pain.  Patient reports her left hip pain decreased by 75%. Patient has improved FOTO to 48% limitation.  Patient will benefit from skilled therapy to left hip to decrease pain and ROM.     Rehab Potential Excellent   Clinical Impairments Affecting Rehab Potential None   PT Frequency 2x / week   PT Duration 8 weeks   PT Treatment/Interventions Cryotherapy;Electrical Stimulation;Iontophoresis 20m/ml Dexamethasone;Stair training;Gait training;Ultrasound;Moist Heat;Therapeutic activities;Therapeutic exercise;Neuromuscular re-education;Patient/family education;Passive range of motion;Manual techniques;Dry needling;Taping   PT Next Visit Plan work on knee and hip extension; work on hip ROM; ionto #2; take left hip measurements   PT Home Exercise Plan  progress as needed   Consulted and Agree with Plan of Care  Patient      Patient will benefit from skilled therapeutic intervention in order to improve the following deficits and impairments:  Abnormal gait, Difficulty walking, Pain, Decreased strength, Decreased mobility, Decreased activity tolerance, Impaired flexibility, Increased muscle spasms, Decreased range of motion  Visit Diagnosis: Other abnormalities of gait and mobility  Stiffness of left knee, not elsewhere classified  Pain in left hip  Stiffness of left hip, not elsewhere classified  Muscle weakness (generalized)       G-Codes - Dec 22, 2016 1153    Functional Assessment Tool Used (Outpatient Only) FOTO score is 48% limitation   Functional Limitation Mobility: Walking and moving around   Mobility: Walking and Moving Around Current Status [phone]) At least 40 percent but less than 60 percent impaired, limited or restricted   Mobility: Walking and Moving Around Goal Status [phone]) At least 40 percent but less than 60 percent impaired, limited or restricted      Problem List Patient Active Problem List   Diagnosis Date Noted  . Varicose veins of lower extremities with other complications 22/41/1464  . Pain in joint, lower leg 08/09/2013    Earlie Counts, PT 12/22/16 12:29 PM   [facility] [address] [place], [place], [address] Phone: [phone]   Fax:  [phone]  Name: TACOYA ALTIZER MRN: [id number] Date of Birth: Mar 05, 1946

## 2016-11-27 ENCOUNTER — Encounter: Payer: Self-pay | Admitting: Physical Therapy

## 2016-11-27 ENCOUNTER — Ambulatory Visit: Payer: Medicare Other | Admitting: Physical Therapy

## 2016-11-27 DIAGNOSIS — M25552 Pain in left hip: Secondary | ICD-10-CM

## 2016-11-27 DIAGNOSIS — M6281 Muscle weakness (generalized): Secondary | ICD-10-CM

## 2016-11-27 DIAGNOSIS — M25652 Stiffness of left hip, not elsewhere classified: Secondary | ICD-10-CM | POA: Diagnosis not present

## 2016-11-27 DIAGNOSIS — R2689 Other abnormalities of gait and mobility: Secondary | ICD-10-CM

## 2016-11-27 DIAGNOSIS — M25662 Stiffness of left knee, not elsewhere classified: Secondary | ICD-10-CM

## 2016-11-27 NOTE — Patient Instructions (Addendum)
Chair Sitting    Sit at edge of seat, spine straight, one leg extended. Put a hand on each thigh and bend forward from the hip, keeping spine straight. Allow hand on extended leg to reach toward toes. Support upper body with other arm. Hold _30__ seconds. Repeat _2__ times per session. Do _1__ sessions per day.  Copyright  VHI. All rights reserved.  Abduction / External Rotation: ROM (Supine)    Position (A) Patient: Bend left leg, foot flat on bed. Helper: Stabilize hip. Place other hand on knee. Motion (B) -Move knee out to side, pelvis remains flat on bed. -Stop at point of tension in inner thigh. Repeat _20__ times. Repeat with other leg. Do __1_ sessions per day. .   Copyright  VHI. All rights reserved.  Supine Knee-to-Chest, Unilateral    Lie on back, hands clasped behind one knee. Pull knee in toward chest until a comfortable stretch is felt in lower back and buttocks. Hold _30__ seconds.  Repeat _2__ times per session. Do _1__ sessions per day.  Copyright  VHI. All rights reserved.  Quad Set    Slowly tighten muscles on thigh of straight leg while counting out loud to _5___. Repeat with other leg. Repeat _10___ times. Do __1__ sessions per day.  http://gt2.exer.us/362   Copyright  VHI. All rights reserved.  ABDUCTION: Standing (Active)    Stand, feet flat. Lift right leg out to side. Use __1_ lbs. Complete _2__ sets of _10__ repetitions. Perform _1__ sessions per day. Keep your body straight. http://gtsc.exer.us/111   Copyright  VHI. All rights reserved.  EXTENSION: Standing (Active)    Stand, both feet flat. Draw right leg behind body as far as possible while tighten your buttocks. Stand erect.Use _1__ lbs. Complete _2__ sets of _10__ repetitions. Perform _1__ sessions per day.  http://gtsc.exer.us/77   Copyright  VHI. All rights reserved.  [place] [address] [place], [place] [address] Phone # [phone] Fax  [phone]

## 2016-11-27 NOTE — Therapy (Signed)
[facility] [address], [place], [place], [address] Phone: [phone]   Fax:  [phone]  Physical Therapy Treatment  Patient Details  Name: Cindy Vaughn MRN: [id number] Date of Birth: [birth year] Referring Provider: Dr. Latanya Maudlin  Encounter Date: 11/27/2016      PT End of Session - 11/27/16 1232    Visit Number 11   Number of Visits 20   Date for PT Re-Evaluation 12/10/16   Authorization Type medicare 10th visit g-code   PT Start Time 4081   PT Stop Time 1230   PT Time Calculation (min) 45 min   Activity Tolerance Patient tolerated treatment well   Behavior During Therapy [facility] for tasks assessed/performed      Past Medical History:  Diagnosis Date  . Leg pain   . Leg swelling   . Varicose veins     Past Surgical History:  Procedure Laterality Date  . ARTHROSCOPY KNEE W/ DRILLING Right 1995  . CESAREAN SECTION  1986  . CHOLECYSTECTOMY    . ENDOVENOUS ABLATION SAPHENOUS VEIN W/ LASER Right 12-20-2012   right Small saphenous vein at [place]  . ENDOVENOUS ABLATION SAPHENOUS VEIN W/ LASER Right 03-23-2013   right greater saphenous vein at [facility]    There were no vitals filed for this visit.      Subjective Assessment - 11/27/16 1155    Subjective I want today to be my last day.     Patient Stated Goals put shoe and sock on left foot wihout an issue   Currently in Pain? No/denies            [facility] PT Assessment - 11/27/16 0001      Assessment   Medical Diagnosis M16.12 Primary osteoarthritis of left hip; M25.552 left hip pain   Referring Provider Dr. Latanya Maudlin   Onset Date/Surgical Date 04/01/16   Prior Therapy None     Precautions   Precautions None     Restrictions   Weight Bearing Restrictions No     Balance Screen   Has the patient fallen in the past 6 months No   Has the patient had a decrease in activity level because of a fear of falling?  No   Is the  patient reluctant to leave their home because of a fear of falling?  No     Home [employer] residence     Prior Function   Level of Independence Independent     Cognition   Overall Cognitive Status Within Functional Limits for tasks assessed     Observation/Other Assessments   Focus on Therapeutic Outcomes (FOTO)  48% limitation     Posture/Postural Control   Posture/Postural Control No significant limitations     PROM   Left Hip Extension 0   Left Hip Flexion 100   Left Hip External Rotation  30   Left Hip Internal Rotation  15   Left Hip ABduction 18   Left Knee Extension -5     Strength   Left Hip Flexion 4/5   Left Hip Extension 4/5   Left Hip External Rotation 4/5   Left Hip Internal Rotation 4/5   Left Hip ABduction 4/5                     [facility] Adult PT Treatment/Exercise - 11/27/16 0001      Knee/Hip Exercises: Aerobic   Nustep level 1 10 min; arm #9,  seat #6   just left leg     Manual Therapy   Manual Therapy Passive ROM   Passive ROM manually stretched hamstring, left hip adductor, left hip flexor                PT Education - 11/27/16 1218    Education provided Yes   Education Details HEP for stretches and strength   Person(s) Educated Patient   Methods Explanation;Demonstration;Handout   Comprehension Verbalized understanding;Returned demonstration          PT Short Term Goals - 11/05/16 1223      PT SHORT TERM GOAL #1   Title independent with initial HEP   Time 4   Period Weeks   Status Achieved     PT SHORT TERM GOAL #2   Title able to put her shoes and socks on with moderate difficulty due to increased left hip ROM   Time 4   Period Weeks   Status Achieved     PT SHORT TERM GOAL #3   Title ability to roll in bed with moderate pain due to decreased tissue tightness   Time 4   Period Weeks   Status Achieved     PT SHORT TERM GOAL #4   Title hip flexion >/= 100 degrees making it  easier to get in and out of a chair   Time 4   Period Weeks   Status Achieved           PT Long Term Goals - 11/27/16 1232      PT LONG TERM GOAL #1   Title independent with HEP   Time 8   Period Weeks   Status Achieved     PT LONG TERM GOAL #2   Title roll in bed with minimal to no pain due to decreased trigger points in left hip   Time 8   Period Weeks   Status Achieved     PT LONG TERM GOAL #3   Title ability to put her shoes and socks on left hip with minimal difficulty due to increased left hip ER >/= 50 degress    Time 8   Period Weeks   Status Not Met     PT LONG TERM GOAL #4   Title sit to stand with minimal difficulty due to increased strength in left hip and bil. knees   Time 8   Period Weeks   Status Achieved     PT LONG TERM GOAL #5   Title FOTO score </= 43% limitation   Time 8   Period Weeks   Status Not Met  48%     PT LONG TERM GOAL #6   Title ambulate with no pain due to increased stride length and increased left hip extension AROM >/= 5 degrees   Time 8   Period Weeks   Status Achieved               Plan - 11/27/16 1234    Clinical Impression Statement Patient is ready for discharge.  She has increased left hip and knee ROM and strength.  Patient is not able to place socks on her feet due to decreased left hip ROM.  Patient is able to roll in bed without pain.  Patient FOTO has improved to 48%.  Patient is ready for discharge.    Rehab Potential Excellent   Clinical Impairments Affecting Rehab Potential None   PT Treatment/Interventions Cryotherapy;Electrical Stimulation;Iontophoresis 71m/ml Dexamethasone;Stair training;Gait training;Ultrasound;Moist Heat;Therapeutic activities;Therapeutic exercise;Neuromuscular  re-education;Patient/family education;Passive range of motion;Manual techniques;Dry needling;Taping   PT Next Visit Plan Discharge to HEP   PT Home Exercise Plan Current HEP   Recommended Other Services None   Consulted and  Agree with Plan of Care Patient      Patient will benefit from skilled therapeutic intervention in order to improve the following deficits and impairments:  Abnormal gait, Difficulty walking, Pain, Decreased strength, Decreased mobility, Decreased activity tolerance, Impaired flexibility, Increased muscle spasms, Decreased range of motion  Visit Diagnosis: Other abnormalities of gait and mobility  Stiffness of left knee, not elsewhere classified  Pain in left hip  Stiffness of left hip, not elsewhere classified  Muscle weakness (generalized)       G-Codes - 12-16-16 1233    Functional Assessment Tool Used (Outpatient Only) FOTO score is 48% limitation   Functional Limitation Mobility: Walking and moving around   Mobility: Walking and Moving Around Goal Status [phone]) At least 40 percent but less than 60 percent impaired, limited or restricted   Mobility: Walking and Moving Around Discharge Status [phone]) At least 40 percent but less than 60 percent impaired, limited or restricted      Problem List Patient Active Problem List   Diagnosis Date Noted  . Varicose veins of lower extremities with other complications 21/94/7125  . Pain in joint, lower leg 08/09/2013    Earlie Counts, PT 12/16/2016 12:38 PM   [facility] [address], [place], [place], [address] Phone: [phone]   Fax:  [phone]  Name: Cindy Vaughn MRN: [id number] Date of Birth: Jul 16, 1946  PHYSICAL THERAPY DISCHARGE SUMMARY  Visits from Start of Care: 11  Current functional level related to goals / functional outcomes: See above.   Remaining deficits: See above. Patient is not able to put her shoes and socks on left foot.   Education / Equipment: HEP Plan: Patient agrees to discharge.  Patient goals were partially met. Patient is being discharged due to being pleased with the current functional level.  Thank you for the referral.  Earlie Counts, PT 16-Dec-2016 12:38 PM  ?????

## 2016-12-02 ENCOUNTER — Ambulatory Visit: Payer: Medicare Other | Admitting: Physical Therapy

## 2016-12-02 DIAGNOSIS — H903 Sensorineural hearing loss, bilateral: Secondary | ICD-10-CM | POA: Diagnosis not present

## 2016-12-02 DIAGNOSIS — H90A31 Mixed conductive and sensorineural hearing loss, unilateral, right ear with restricted hearing on the contralateral side: Secondary | ICD-10-CM | POA: Diagnosis not present

## 2016-12-02 DIAGNOSIS — H90A22 Sensorineural hearing loss, unilateral, left ear, with restricted hearing on the contralateral side: Secondary | ICD-10-CM | POA: Diagnosis not present

## 2016-12-02 DIAGNOSIS — H60333 Swimmer's ear, bilateral: Secondary | ICD-10-CM | POA: Diagnosis not present

## 2016-12-04 ENCOUNTER — Encounter: Payer: Self-pay | Admitting: Physical Therapy

## 2016-12-09 ENCOUNTER — Encounter: Payer: Self-pay | Admitting: Physical Therapy

## 2016-12-24 DIAGNOSIS — Z961 Presence of intraocular lens: Secondary | ICD-10-CM | POA: Diagnosis not present

## 2017-01-16 DIAGNOSIS — H8110 Benign paroxysmal vertigo, unspecified ear: Secondary | ICD-10-CM | POA: Diagnosis not present

## 2017-02-04 DIAGNOSIS — H8113 Benign paroxysmal vertigo, bilateral: Secondary | ICD-10-CM | POA: Diagnosis not present

## 2017-02-16 DIAGNOSIS — Z6830 Body mass index (BMI) 30.0-30.9, adult: Secondary | ICD-10-CM | POA: Diagnosis not present

## 2017-02-16 DIAGNOSIS — Z01419 Encounter for gynecological examination (general) (routine) without abnormal findings: Secondary | ICD-10-CM | POA: Diagnosis not present

## 2017-02-16 DIAGNOSIS — Z1231 Encounter for screening mammogram for malignant neoplasm of breast: Secondary | ICD-10-CM | POA: Diagnosis not present

## 2017-02-17 DIAGNOSIS — H26491 Other secondary cataract, right eye: Secondary | ICD-10-CM | POA: Diagnosis not present

## 2017-02-17 DIAGNOSIS — H02839 Dermatochalasis of unspecified eye, unspecified eyelid: Secondary | ICD-10-CM | POA: Diagnosis not present

## 2017-02-17 DIAGNOSIS — H18413 Arcus senilis, bilateral: Secondary | ICD-10-CM | POA: Diagnosis not present

## 2017-02-17 DIAGNOSIS — Z961 Presence of intraocular lens: Secondary | ICD-10-CM | POA: Diagnosis not present

## 2017-02-24 DIAGNOSIS — Z9841 Cataract extraction status, right eye: Secondary | ICD-10-CM | POA: Diagnosis not present

## 2017-02-25 DIAGNOSIS — M1612 Unilateral primary osteoarthritis, left hip: Secondary | ICD-10-CM | POA: Diagnosis not present

## 2017-02-25 DIAGNOSIS — M1711 Unilateral primary osteoarthritis, right knee: Secondary | ICD-10-CM | POA: Diagnosis not present

## 2017-05-05 DIAGNOSIS — R194 Change in bowel habit: Secondary | ICD-10-CM | POA: Diagnosis not present

## 2017-05-05 DIAGNOSIS — K5904 Chronic idiopathic constipation: Secondary | ICD-10-CM | POA: Diagnosis not present

## 2017-05-05 DIAGNOSIS — R140 Abdominal distension (gaseous): Secondary | ICD-10-CM | POA: Diagnosis not present

## 2017-06-02 DIAGNOSIS — K5730 Diverticulosis of large intestine without perforation or abscess without bleeding: Secondary | ICD-10-CM | POA: Diagnosis not present

## 2017-06-02 DIAGNOSIS — K5904 Chronic idiopathic constipation: Secondary | ICD-10-CM | POA: Diagnosis not present

## 2017-06-16 DIAGNOSIS — E669 Obesity, unspecified: Secondary | ICD-10-CM | POA: Diagnosis not present

## 2017-06-16 DIAGNOSIS — Z713 Dietary counseling and surveillance: Secondary | ICD-10-CM | POA: Diagnosis not present

## 2017-06-16 DIAGNOSIS — M542 Cervicalgia: Secondary | ICD-10-CM | POA: Diagnosis not present

## 2017-06-16 DIAGNOSIS — Z6831 Body mass index (BMI) 31.0-31.9, adult: Secondary | ICD-10-CM | POA: Diagnosis not present

## 2017-06-23 DIAGNOSIS — N3001 Acute cystitis with hematuria: Secondary | ICD-10-CM | POA: Diagnosis not present

## 2017-06-23 DIAGNOSIS — R300 Dysuria: Secondary | ICD-10-CM | POA: Diagnosis not present

## 2017-10-02 DIAGNOSIS — Z23 Encounter for immunization: Secondary | ICD-10-CM | POA: Diagnosis not present

## 2017-10-02 DIAGNOSIS — G252 Other specified forms of tremor: Secondary | ICD-10-CM | POA: Diagnosis not present

## 2017-10-02 DIAGNOSIS — E669 Obesity, unspecified: Secondary | ICD-10-CM | POA: Diagnosis not present

## 2017-10-02 DIAGNOSIS — Z6831 Body mass index (BMI) 31.0-31.9, adult: Secondary | ICD-10-CM | POA: Diagnosis not present

## 2017-10-02 DIAGNOSIS — Z0000 Encounter for general adult medical examination without abnormal findings: Secondary | ICD-10-CM | POA: Diagnosis not present

## 2017-10-02 DIAGNOSIS — Z1389 Encounter for screening for other disorder: Secondary | ICD-10-CM | POA: Diagnosis not present

## 2017-10-19 ENCOUNTER — Emergency Department (HOSPITAL_COMMUNITY): Payer: Medicare Other

## 2017-10-19 ENCOUNTER — Emergency Department (HOSPITAL_COMMUNITY)
Admission: EM | Admit: 2017-10-19 | Discharge: 2017-10-19 | Disposition: A | Discharge status: Home / Self Care | Payer: Medicare Other | Attending: Emergency Medicine | Admitting: Emergency Medicine

## 2017-10-19 ENCOUNTER — Other Ambulatory Visit: Payer: Self-pay

## 2017-10-19 ENCOUNTER — Encounter (HOSPITAL_COMMUNITY): Payer: Self-pay

## 2017-10-19 DIAGNOSIS — R109 Unspecified abdominal pain: Secondary | ICD-10-CM | POA: Insufficient documentation

## 2017-10-19 DIAGNOSIS — R42 Dizziness and giddiness: Secondary | ICD-10-CM | POA: Insufficient documentation

## 2017-10-19 DIAGNOSIS — R110 Nausea: Secondary | ICD-10-CM | POA: Diagnosis not present

## 2017-10-19 DIAGNOSIS — R9431 Abnormal electrocardiogram [ECG] [EKG]: Secondary | ICD-10-CM

## 2017-10-19 DIAGNOSIS — Z87891 Personal history of nicotine dependence: Secondary | ICD-10-CM | POA: Diagnosis not present

## 2017-10-19 LAB — COMPREHENSIVE METABOLIC PANEL
ALT: 15 U/L (ref 14–54)
ANION GAP: 10 (ref 5–15)
AST: 20 U/L (ref 15–41)
Albumin: 3.9 g/dL (ref 3.5–5.0)
Alkaline Phosphatase: 63 U/L (ref 38–126)
BUN: 13 mg/dL (ref 6–20)
CHLORIDE: 106 mmol/L (ref 101–111)
CO2: 24 mmol/L (ref 22–32)
Calcium: 9 mg/dL (ref 8.9–10.3)
Creatinine, Ser: 0.73 mg/dL (ref 0.44–1.00)
GFR calc Af Amer: >60 mL/min (ref >60)
Glucose, Bld: 136 mg/dL — ABNORMAL HIGH (ref 65–99)
POTASSIUM: 3.7 mmol/L (ref 3.5–5.1)
Sodium: 140 mmol/L (ref 135–145)
Total Bilirubin: 0.6 mg/dL (ref 0.3–1.2)
Total Protein: 6.8 g/dL (ref 6.5–8.1)

## 2017-10-19 LAB — URINALYSIS, ROUTINE W REFLEX MICROSCOPIC
Bilirubin Urine: NEGATIVE
GLUCOSE, UA: NEGATIVE mg/dL
Hgb urine dipstick: NEGATIVE
Ketones, ur: 5 mg/dL — AB
Leukocytes, UA: NEGATIVE
Nitrite: NEGATIVE
PH: 5 (ref 5.0–8.0)
PROTEIN: NEGATIVE mg/dL
Specific Gravity, Urine: 1.008 (ref 1.005–1.030)

## 2017-10-19 LAB — CBC
HCT: 39.8 % (ref 36.0–46.0)
Hemoglobin: 12.9 g/dL (ref 12.0–15.0)
MCH: 30.3 pg (ref 26.0–34.0)
MCHC: 32.4 g/dL (ref 30.0–36.0)
MCV: 93.4 fL (ref 78.0–100.0)
PLATELETS: 265 10*3/uL (ref 150–400)
RBC: 4.26 MIL/uL (ref 3.87–5.11)
RDW: 14.2 % (ref 11.5–15.5)
WBC: 6.6 10*3/uL (ref 4.0–10.5)

## 2017-10-19 LAB — LIPASE, BLOOD: LIPASE: 37 U/L (ref 11–51)

## 2017-10-19 LAB — BRAIN NATRIURETIC PEPTIDE: B NATRIURETIC PEPTIDE 5: 19.1 pg/mL (ref 0.0–100.0)

## 2017-10-19 LAB — I-STAT TROPONIN, ED: Troponin i, poc: 0 ng/mL (ref 0.00–0.08)

## 2017-10-19 MED ORDER — MECLIZINE HCL 25 MG PO TABS: 25.0000 mg | ORAL_TABLET | Freq: Three times a day (TID) | ORAL | 0 refills | Status: AC | PRN

## 2017-10-19 MED ORDER — MECLIZINE HCL 25 MG PO TABS
25.0000 mg | ORAL_TABLET | Freq: Once | ORAL | Status: AC
  Administered 2017-10-19: 25 mg via ORAL
  Filled 2017-10-19: qty 1

## 2017-10-19 NOTE — ED Triage Notes (Signed)
Pt presents to the ed with complaints of dizziness, nausea and abdominal pain x 4 days. No focal neuro deficits present in triage. NAD in triage.

## 2017-10-19 NOTE — Discharge Instructions (Signed)
Meclizine for dizziness as prescribed. Please follow up with family doctor regarding your symptoms and abnormal ECG. Drink plenty of fluids. Return if worsening symptoms.

## 2017-10-19 NOTE — ED Notes (Signed)
Pt had abnormal EKG per MD, pt moved to room with cardiac monitoring per EDP.

## 2017-10-19 NOTE — ED Provider Notes (Signed)
[place] EMERGENCY DEPARTMENT Provider Note   CSN: [id number] Arrival date & time: 10/19/17  1121     History   Chief Complaint Chief Complaint  Patient presents with  . Dizziness  . Nausea    HPI Cindy Vaughn is a 72 y.o. female.  HPI Cindy Vaughn is a 72 y.o. female presents to emergency department with complaint of abdominal pain, nausea, dizziness.  Patient symptoms began 4 days ago. Pt states her dizziness is worse with position changes, laying down flat, and ambulation. She states it feels like "room spinning." denies headache, ear pain, tinnitus, congestion. States had flu like symptoms 2 wks ago, but now improved. Pt also reports some abdominal "tightness and discomfort." States he has been going on for about 5 days. She is currently prescribed Linzess for her IBS, states taking it intermittently and states it give her diarrhea. Pt also reports associated nausea. Pt states all of the symptoms she is having a similar to when she had gallbladder pancreatitis in 2011. Pt had cholecystectomy and has not had any problems since then. She denies any other changes in her medical hx, no new prescriptions.   Past Medical History:  Diagnosis Date  . Leg pain   . Leg swelling   . Varicose veins     Patient Active Problem List   Diagnosis Date Noted  . Varicose veins of lower extremities with other complications 31/51/7616  . Pain in joint, lower leg 08/09/2013    Past Surgical History:  Procedure Laterality Date  . ARTHROSCOPY KNEE W/ DRILLING Right 1995  . CESAREAN SECTION  1986  . CHOLECYSTECTOMY    . ENDOVENOUS ABLATION SAPHENOUS VEIN W/ LASER Right 12-20-2012   right Small saphenous vein at [place]  . ENDOVENOUS ABLATION SAPHENOUS VEIN W/ LASER Right 03-23-2013   right greater saphenous vein at [place]    OB History    No data available       Home Medications    Prior to Admission medications   Medication Sig Start Date End  Date Taking? Authorizing Provider  HYDROcodone-acetaminophen (NORCO) 5-325 MG per tablet Take 1 tablet by mouth every 4 (four) hours as needed. Patient not taking: Reported on 10/15/2016 12/03/13   Maude Leriche, PA-C  meloxicam (MOBIC) 7.5 MG tablet Take 7.5 mg by mouth daily.    [provider]    Family History Family History  Problem Relation Age of Onset  . Heart disease Mother   . Cancer Father   . Varicose Veins Father   . Varicose Veins Sister     Social History Social History   Tobacco Use  . Smoking status: Former Smoker    Last attempt to quit: 09/01/1969    Years since quitting: 48.1  . Smokeless tobacco: Never Used  Substance Use Topics  . Alcohol use: No  . Drug use: No     Allergies   Patient has no known allergies.   Review of Systems Review of Systems  Constitutional: Negative for chills and fever.  HENT: Negative for congestion.   Eyes: Negative for visual disturbance.  Respiratory: Negative for cough, chest tightness and shortness of breath.   Cardiovascular: Negative for chest pain, palpitations and leg swelling.  Gastrointestinal: Positive for abdominal pain and nausea. Negative for diarrhea and vomiting.  Genitourinary: Negative for dysuria, flank pain, pelvic pain, vaginal bleeding, vaginal discharge and vaginal pain.  Musculoskeletal: Negative for arthralgias, myalgias, neck pain and neck stiffness.  Skin: Negative  for rash.  Neurological: Positive for dizziness and light-headedness. Negative for facial asymmetry, speech difficulty, weakness and headaches.  All other systems reviewed and are negative.    Physical Exam Updated Vital Signs BP 128/70 (BP Location: Left Arm)   Pulse 70   Temp 98.2 F (36.8 C) (Oral)   Resp 16   Wt 83 kg (183 lb)   SpO2 98%   BMI 30.45 kg/m   Physical Exam  Constitutional: She is oriented to person, place, and time. She appears well-developed and well-nourished. No distress.  HENT:  Head:  Normocephalic.  Eyes: Conjunctivae are normal.  Neck: Neck supple.  Cardiovascular: Normal rate, regular rhythm and normal heart sounds.  Pulmonary/Chest: Effort normal and breath sounds normal. No respiratory distress. She has no wheezes. She has no rales.  Abdominal: Soft. Bowel sounds are normal. She exhibits no distension. There is no tenderness. There is no rebound.  Musculoskeletal: She exhibits no edema.  Neurological: She is alert and oriented to person, place, and time.  Skin: Skin is warm and dry.  Psychiatric: She has a normal mood and affect. Her behavior is normal.  Nursing note and vitals reviewed.    ED Treatments / Results  Labs (all labs ordered are listed, but only abnormal results are displayed) Labs Reviewed  COMPREHENSIVE METABOLIC PANEL - Abnormal; Notable for the following components:      Result Value   Glucose, Bld 136 (*)    All other components within normal limits  URINALYSIS, ROUTINE W REFLEX MICROSCOPIC - Abnormal; Notable for the following components:   Ketones, ur 5 (*)    All other components within normal limits  LIPASE, BLOOD  CBC  BRAIN NATRIURETIC PEPTIDE  I-STAT TROPONIN, ED    EKG  EKG Interpretation  Date/Time:  Monday October 19 2017 11:58:12 EST Ventricular Rate:  82 PR Interval:  188 QRS Duration: 92 QT Interval:  326 QTC Calculation: 380 R Axis:   70 Text Interpretation:  Normal sinus rhythm Incomplete right bundle branch block T wave abnormality, consider inferolateral ischemia Abnormal ECG Confirmed by Julianne Rice [phone] on 10/19/2017 3:38:34 PM       Radiology Dg Chest 2 View  Result Date: 10/19/2017 CLINICAL DATA:  Pt presents to the ed with complaints of dizziness, nausea and abdominal pain x 4 days. EXAM: CHEST  2 VIEW COMPARISON:  10/12/2009 FINDINGS: The heart size and mediastinal contours are within normal limits. Both lungs are clear. The visualized skeletal structures are unremarkable. IMPRESSION: No active  cardiopulmonary disease. Electronically Signed   By: Nolon Nations M.D.   On: 10/19/2017 17:22   Mr Brain Wo Contrast  Result Date: 10/19/2017 CLINICAL DATA:  Dizziness, nausea and abdominal pain for 4 days. EXAM: MRI HEAD WITHOUT CONTRAST TECHNIQUE: Multiplanar, multiecho pulse sequences of the brain and surrounding structures were obtained without intravenous contrast. COMPARISON:  CT neck November 13, 2016 FINDINGS: INTRACRANIAL CONTENTS: No reduced diffusion to suggest acute ischemia. No susceptibility artifact to suggest hemorrhage. Moderate parenchymal brain volume loss. Patchy supratentorial white matter FLAIR T2 hyperintensities. No suspicious parenchymal signal, masses, mass effect. No abnormal extra-axial fluid collections. No extra-axial masses. VASCULAR: Normal major intracranial vascular flow voids present at skull base. SKULL AND UPPER CERVICAL SPINE: No abnormal sellar expansion. No suspicious calvarial bone marrow signal. Craniocervical junction maintained. SINUSES/ORBITS: The mastoid air-cells and included paranasal sinuses are well-aerated.The included ocular globes and orbital contents are non-suspicious. Status post bilateral ocular lens implants. OTHER: None. IMPRESSION: 1. No acute intracranial process. 2.  Moderate parenchymal brain volume loss. 3. Mild-to-moderate chronic small vessel ischemic disease. Electronically Signed   By: Elon Alas M.D.   On: 10/19/2017 19:17    Procedures Procedures (including critical care time)  Medications Ordered in ED Medications - No data to display   Initial Impression / Assessment and Plan / ED Course  I have reviewed the triage vital signs and the nursing notes.  Pertinent labs & imaging results that were available during my care of the patient were reviewed by me and considered in my medical decision making (see chart for details).     Pt in ED with vertigo like symptoms, nausea, epigastric discomfort. Labs obtained at triage,  all normal, including LFTs, lipase. ECG with diffuse t wave inversions and ST depressions, concerning for ischemia. WIll add trop. Will also try meclizine and CXR. Orthostatics and urine analysis also pending. Abdomen benign.  Trop, CXR negative. Discussed with Dr. Lita Mains, will get MRI to RO central cause of pt's vertigo symptoms. Meclizine ordered, will reassess. VS remain normal. Pt in NAD   Pt feels better.  She was able to ambulate with no difficulty around the department.  MRI is negative.  Patient reassured.  Most likely peripheral vertigo, especially since she just had flulike symptoms.  Will start on meclizine.  She does have abnormal EKG, she will need to follow-up closely with her doctor or cardiology.  Vitals:   10/19/17 1157 10/19/17 1200 10/19/17 1447 10/19/17 1728  BP: 123/77  128/70   Pulse: 86  70   Resp: 18  16   Temp: 98.8 F (37.1 C)  98.2 F (36.8 C)   TempSrc: Oral  Oral   SpO2: 97%  98%   Weight:  83 kg (183 lb)  83 kg (183 lb)  Height:    5' 5.5" (1.664 m)    Final Clinical Impressions(s) / ED Diagnoses   Final diagnoses:  Vertigo  Abdominal pain, unspecified abdominal location  Abnormal ECG    ED Discharge Orders        Ordered    meclizine (ANTIVERT) 25 MG tablet  3 times daily PRN     10/19/17 2009       Jeannett Senior, PA-C 10/20/17 0026    Julianne Rice, MD 10/22/17 1026

## 2017-10-19 NOTE — ED Notes (Signed)
ED Provider at bedside. 

## 2017-10-19 NOTE — ED Notes (Signed)
Patient verbalizes understanding of discharge instructions. Opportunity for questioning and answers were provided. Armband removed by staff, pt discharged from ED ambulatory.   

## 2017-10-27 DIAGNOSIS — K5730 Diverticulosis of large intestine without perforation or abscess without bleeding: Secondary | ICD-10-CM | POA: Diagnosis not present

## 2017-10-27 DIAGNOSIS — N390 Urinary tract infection, site not specified: Secondary | ICD-10-CM | POA: Diagnosis not present

## 2017-10-27 DIAGNOSIS — K5904 Chronic idiopathic constipation: Secondary | ICD-10-CM | POA: Diagnosis not present

## 2017-11-03 DIAGNOSIS — K219 Gastro-esophageal reflux disease without esophagitis: Secondary | ICD-10-CM | POA: Diagnosis not present

## 2017-11-03 DIAGNOSIS — K5730 Diverticulosis of large intestine without perforation or abscess without bleeding: Secondary | ICD-10-CM | POA: Diagnosis not present

## 2017-11-03 DIAGNOSIS — K5904 Chronic idiopathic constipation: Secondary | ICD-10-CM | POA: Diagnosis not present

## 2017-11-17 ENCOUNTER — Other Ambulatory Visit: Payer: Self-pay | Admitting: Gastroenterology

## 2017-11-17 DIAGNOSIS — K5730 Diverticulosis of large intestine without perforation or abscess without bleeding: Secondary | ICD-10-CM | POA: Diagnosis not present

## 2017-11-17 DIAGNOSIS — K5904 Chronic idiopathic constipation: Secondary | ICD-10-CM | POA: Diagnosis not present

## 2017-11-17 DIAGNOSIS — R1032 Left lower quadrant pain: Secondary | ICD-10-CM

## 2017-11-25 ENCOUNTER — Ambulatory Visit
Admission: RE | Admit: 2017-11-25 | Discharge: 2017-11-25 | Disposition: A | Discharge status: Home / Self Care | Payer: Medicare Other | Source: Ambulatory Visit | Attending: Gastroenterology | Admitting: Gastroenterology

## 2017-11-25 DIAGNOSIS — K5730 Diverticulosis of large intestine without perforation or abscess without bleeding: Secondary | ICD-10-CM | POA: Diagnosis not present

## 2017-11-25 DIAGNOSIS — R1032 Left lower quadrant pain: Secondary | ICD-10-CM

## 2017-11-25 MED ORDER — IOPAMIDOL (ISOVUE-300) INJECTION 61%
100.0000 mL | Freq: Once | INTRAVENOUS | Status: AC | PRN
  Administered 2017-11-25: 100 mL via INTRAVENOUS

## 2017-11-30 DIAGNOSIS — M542 Cervicalgia: Secondary | ICD-10-CM | POA: Diagnosis not present

## 2017-11-30 DIAGNOSIS — M5030 Other cervical disc degeneration, unspecified cervical region: Secondary | ICD-10-CM | POA: Diagnosis not present

## 2017-12-09 IMAGING — CT CT NECK W/ CM
3 of 4 series · 13 of 33 positions shown, 16 images · IV contrast (APPLIED)
Comparison: None.

CLINICAL DATA: Facial swelling.  Lump in neck.

EXAM:
CT NECK WITH CONTRAST
TECHNIQUE: Multidetector CT imaging of the neck was performed using the
standard protocol following the bolus administration of intravenous
contrast.
CONTRAST:  75mL QBUDJC-N44 IOPAMIDOL (QBUDJC-N44) INJECTION 61%

[Series 3: neck 2.0 i31s 3 · axial · 0.55mm/px · z∈[+1376,+1532]mm · 5 of 118 slices shown, 7 images]
[im 20/118  soft-tissue]
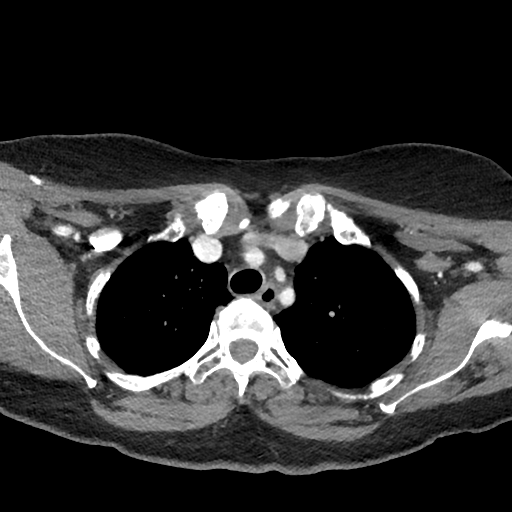
[im 20/118  bone]
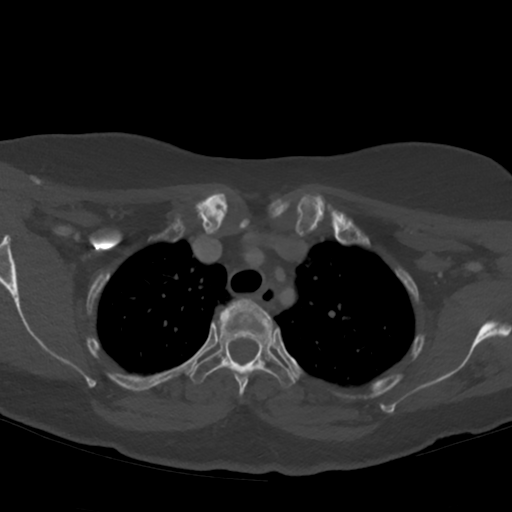
[im 40/118  bone]
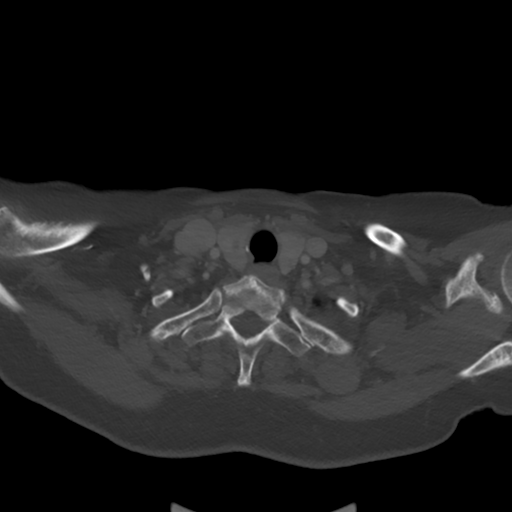
[im 59/118  bone]
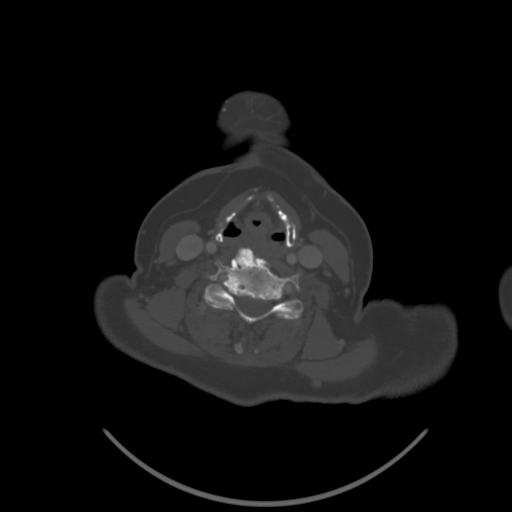
[im 79/118  bone]
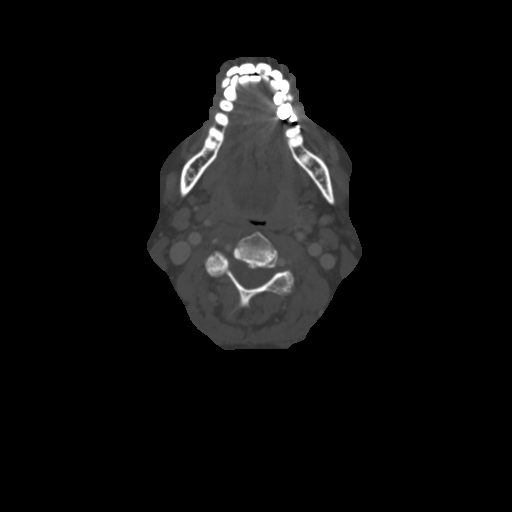
[im 98/118  soft-tissue]
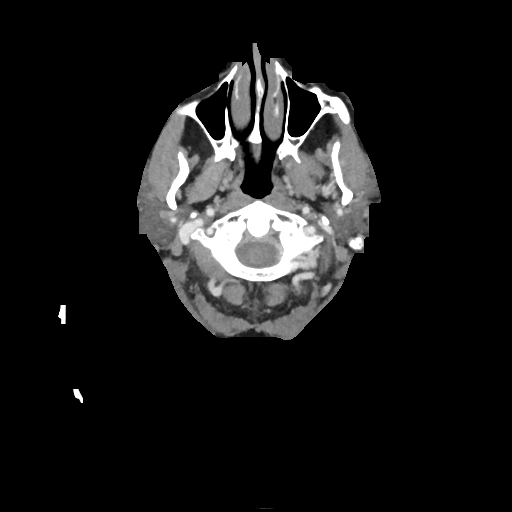
[im 98/118  bone]
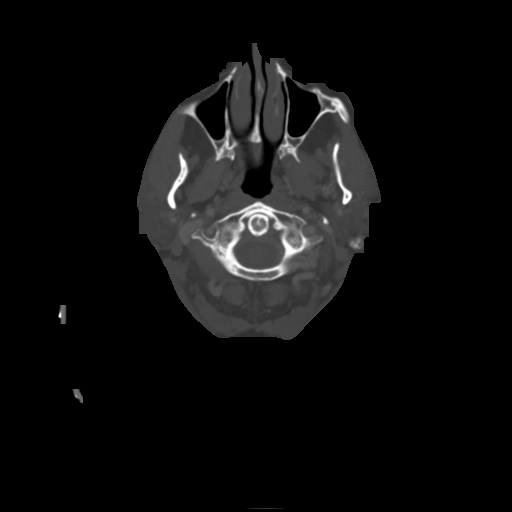

[Series 6: coronal st · coronal · 0.33mm/px · 3 of 128 slices shown]
[im 26/128  bone]
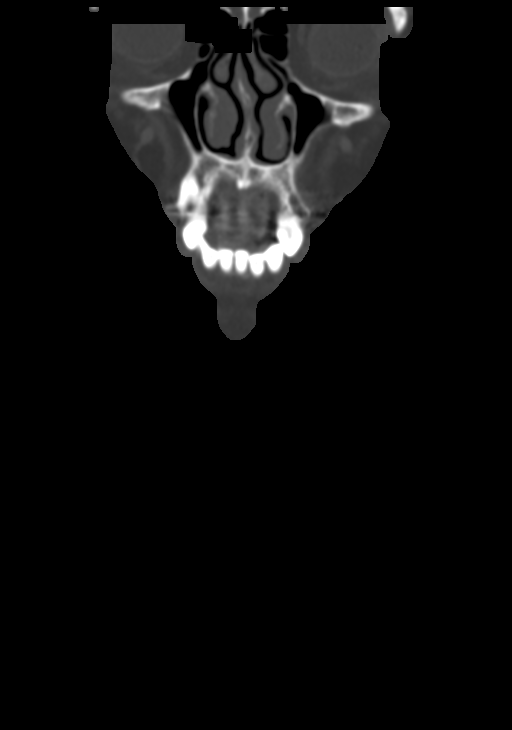
[im 51/128  bone]
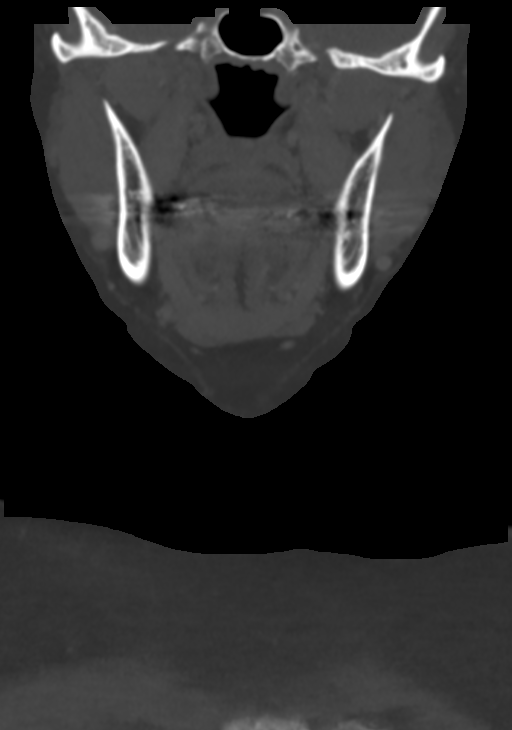
[im 77/128  bone]
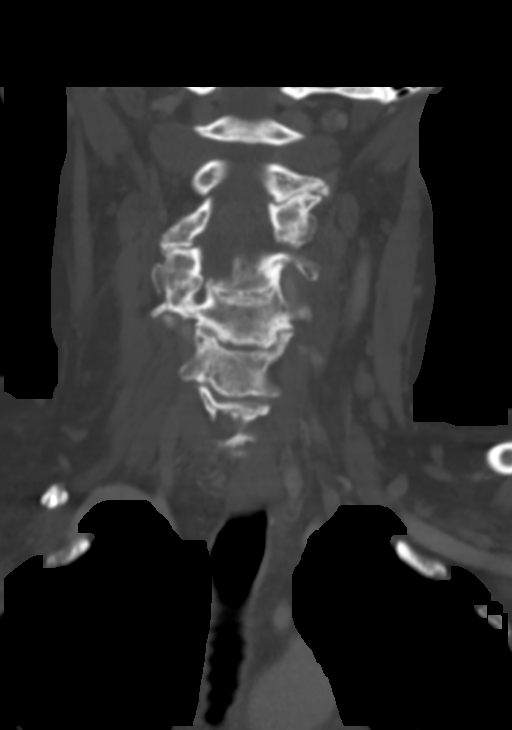

[Series 7: sagittal st · sagittal · 0.47mm/px · 5 of 80 slices shown, 6 images]
[im 27/80  bone]
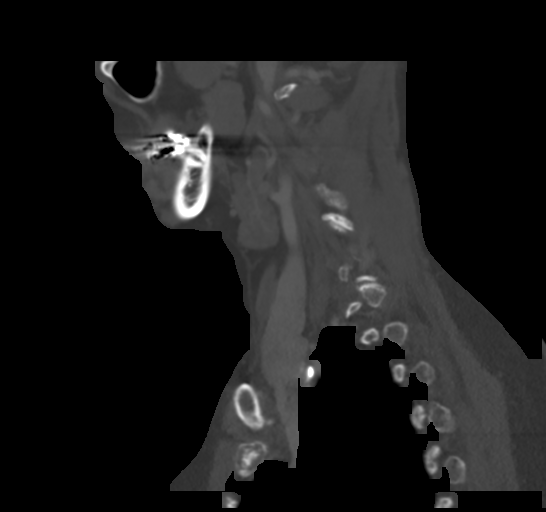
[im 33/80  bone]
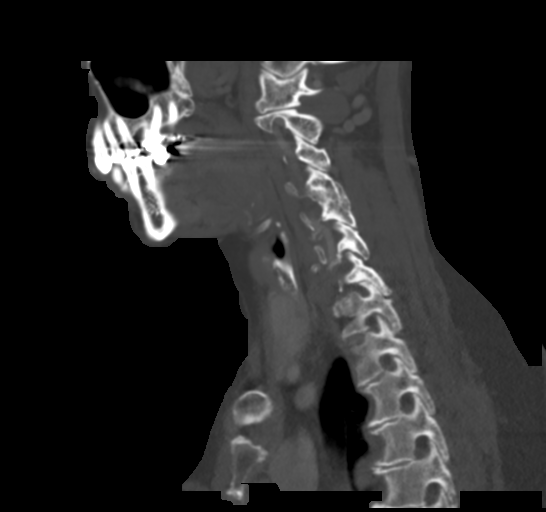
[im 40/80  soft-tissue]
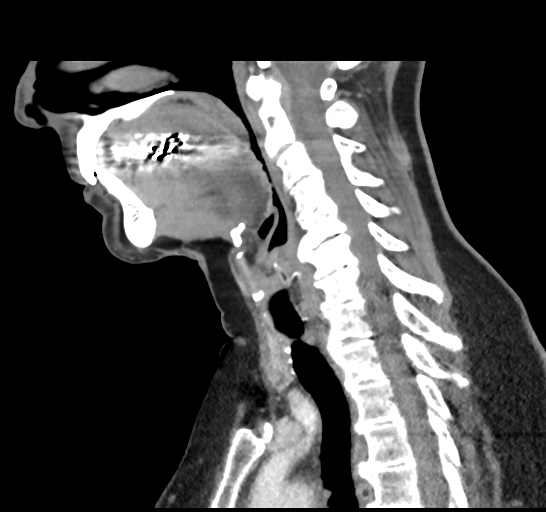
[im 40/80  bone]
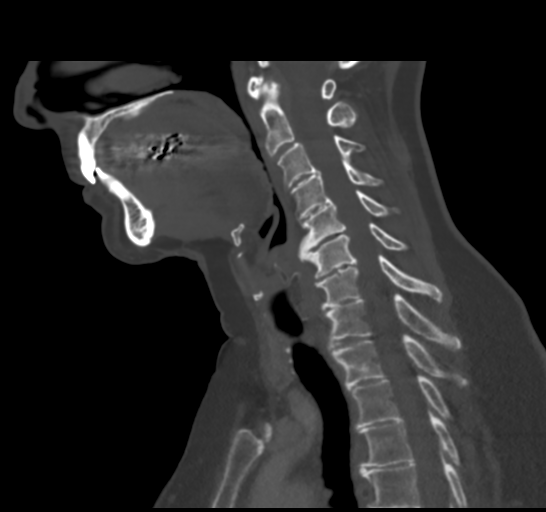
[im 47/80  bone]
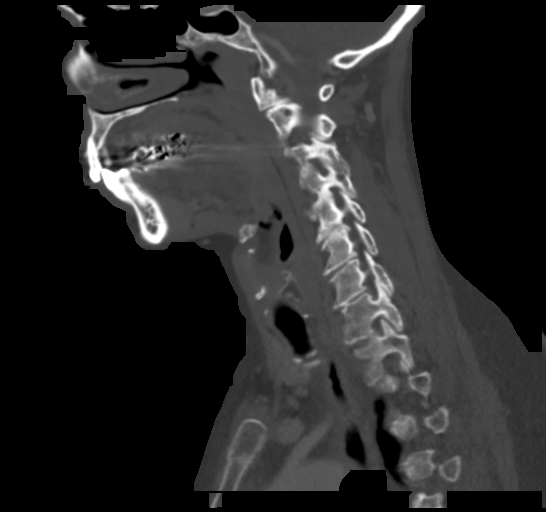
[im 53/80  bone]
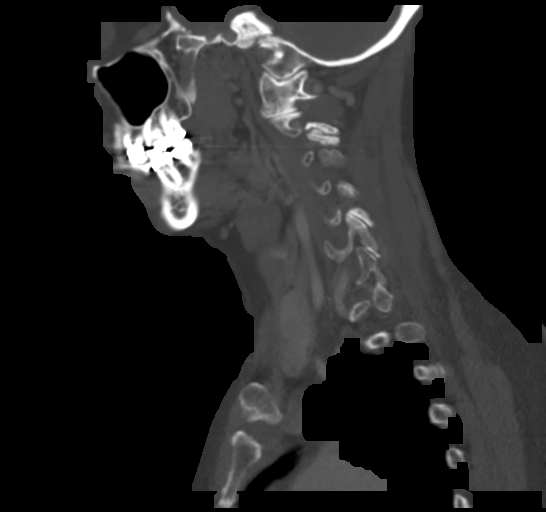

[13 of 33 positions shown; findings below may reference images not displayed]

FINDINGS: Pharynx and larynx: Normal. No mass or swelling.

Salivary glands: No inflammation, mass, or stone. There is symmetric
scarring superficial to the parotids which is likely related
patient's history of cosmetic surgery. Mild scarring along the chin
may be related to the same.

Thyroid: Bilateral thyroid nodules, up to 8 mm on the right.
Usually, thyroid sonogram is reserved for nodules measuring 15 mm or
greater. No history of neck radiation per the EMR

Lymph nodes: No enlarged or abnormal density lymph nodes.

Vascular: Mild atherosclerotic calcifications.

Limited intracranial: Negative

Visualized orbits: Bilateral cataract resection. No pathologic
finding.

Mastoids and visualized paranasal sinuses: Clear.

Skeleton: No acute or aggressive finding. Diffuse degenerative disc
narrowing. Multilevel facet arthropathy with ankylosis seen at C3-4
and C4-5.

Upper chest: Negative
IMPRESSION: 1. No mass or inflammation to explain history of neck swelling.
2. Small thyroid nodules measuring up to 8 mm, below size threshold
for sonographic follow-up per consensus guidelines.

## 2017-12-22 DIAGNOSIS — N9489 Other specified conditions associated with female genital organs and menstrual cycle: Secondary | ICD-10-CM | POA: Diagnosis not present

## 2018-01-06 DIAGNOSIS — H43812 Vitreous degeneration, left eye: Secondary | ICD-10-CM | POA: Diagnosis not present

## 2018-02-17 DIAGNOSIS — Z6831 Body mass index (BMI) 31.0-31.9, adult: Secondary | ICD-10-CM | POA: Diagnosis not present

## 2018-02-17 DIAGNOSIS — Z779 Other contact with and (suspected) exposures hazardous to health: Secondary | ICD-10-CM | POA: Diagnosis not present

## 2018-02-17 DIAGNOSIS — Z1231 Encounter for screening mammogram for malignant neoplasm of breast: Secondary | ICD-10-CM | POA: Diagnosis not present

## 2018-02-17 DIAGNOSIS — Z01419 Encounter for gynecological examination (general) (routine) without abnormal findings: Secondary | ICD-10-CM | POA: Diagnosis not present

## 2018-03-02 DIAGNOSIS — M545 Low back pain: Secondary | ICD-10-CM | POA: Diagnosis not present

## 2018-03-16 DIAGNOSIS — M25511 Pain in right shoulder: Secondary | ICD-10-CM | POA: Diagnosis not present

## 2018-03-16 DIAGNOSIS — M545 Low back pain: Secondary | ICD-10-CM | POA: Diagnosis not present

## 2018-04-02 DIAGNOSIS — R350 Frequency of micturition: Secondary | ICD-10-CM | POA: Diagnosis not present

## 2018-05-04 DIAGNOSIS — I83893 Varicose veins of bilateral lower extremities with other complications: Secondary | ICD-10-CM | POA: Diagnosis not present

## 2018-05-04 DIAGNOSIS — I83811 Varicose veins of right lower extremities with pain: Secondary | ICD-10-CM | POA: Diagnosis not present

## 2018-05-07 DIAGNOSIS — J208 Acute bronchitis due to other specified organisms: Secondary | ICD-10-CM | POA: Diagnosis not present

## 2018-05-27 DIAGNOSIS — I83811 Varicose veins of right lower extremities with pain: Secondary | ICD-10-CM | POA: Diagnosis not present

## 2018-06-17 DIAGNOSIS — M79604 Pain in right leg: Secondary | ICD-10-CM | POA: Diagnosis not present

## 2018-06-17 DIAGNOSIS — I890 Lymphedema, not elsewhere classified: Secondary | ICD-10-CM | POA: Diagnosis not present

## 2018-06-17 DIAGNOSIS — I83891 Varicose veins of right lower extremities with other complications: Secondary | ICD-10-CM | POA: Diagnosis not present

## 2018-06-24 DIAGNOSIS — M1711 Unilateral primary osteoarthritis, right knee: Secondary | ICD-10-CM | POA: Diagnosis not present

## 2018-07-05 DIAGNOSIS — D1722 Benign lipomatous neoplasm of skin and subcutaneous tissue of left arm: Secondary | ICD-10-CM | POA: Diagnosis not present

## 2018-07-05 DIAGNOSIS — D1721 Benign lipomatous neoplasm of skin and subcutaneous tissue of right arm: Secondary | ICD-10-CM | POA: Diagnosis not present

## 2018-07-05 DIAGNOSIS — L718 Other rosacea: Secondary | ICD-10-CM | POA: Diagnosis not present

## 2018-07-05 DIAGNOSIS — L821 Other seborrheic keratosis: Secondary | ICD-10-CM | POA: Diagnosis not present

## 2018-07-05 DIAGNOSIS — L820 Inflamed seborrheic keratosis: Secondary | ICD-10-CM | POA: Diagnosis not present

## 2018-07-05 DIAGNOSIS — D225 Melanocytic nevi of trunk: Secondary | ICD-10-CM | POA: Diagnosis not present

## 2018-08-09 DIAGNOSIS — I890 Lymphedema, not elsewhere classified: Secondary | ICD-10-CM | POA: Diagnosis not present

## 2018-08-09 DIAGNOSIS — I83891 Varicose veins of right lower extremities with other complications: Secondary | ICD-10-CM | POA: Diagnosis not present

## 2018-10-04 DIAGNOSIS — Z0000 Encounter for general adult medical examination without abnormal findings: Secondary | ICD-10-CM | POA: Diagnosis not present

## 2018-10-04 DIAGNOSIS — Z1389 Encounter for screening for other disorder: Secondary | ICD-10-CM | POA: Diagnosis not present

## 2018-10-04 DIAGNOSIS — Z136 Encounter for screening for cardiovascular disorders: Secondary | ICD-10-CM | POA: Diagnosis not present

## 2019-03-15 DIAGNOSIS — N958 Other specified menopausal and perimenopausal disorders: Secondary | ICD-10-CM | POA: Diagnosis not present

## 2019-03-15 DIAGNOSIS — Z6830 Body mass index (BMI) 30.0-30.9, adult: Secondary | ICD-10-CM | POA: Diagnosis not present

## 2019-03-15 DIAGNOSIS — Z124 Encounter for screening for malignant neoplasm of cervix: Secondary | ICD-10-CM | POA: Diagnosis not present

## 2019-03-15 DIAGNOSIS — Z1231 Encounter for screening mammogram for malignant neoplasm of breast: Secondary | ICD-10-CM | POA: Diagnosis not present

## 2019-03-15 DIAGNOSIS — M8588 Other specified disorders of bone density and structure, other site: Secondary | ICD-10-CM | POA: Diagnosis not present

## 2019-03-15 DIAGNOSIS — Z779 Other contact with and (suspected) exposures hazardous to health: Secondary | ICD-10-CM | POA: Diagnosis not present

## 2019-05-17 DIAGNOSIS — H52223 Regular astigmatism, bilateral: Secondary | ICD-10-CM | POA: Diagnosis not present

## 2019-05-17 DIAGNOSIS — H43813 Vitreous degeneration, bilateral: Secondary | ICD-10-CM | POA: Diagnosis not present

## 2019-05-17 DIAGNOSIS — H524 Presbyopia: Secondary | ICD-10-CM | POA: Diagnosis not present

## 2019-05-17 DIAGNOSIS — Z9849 Cataract extraction status, unspecified eye: Secondary | ICD-10-CM | POA: Diagnosis not present

## 2019-05-17 DIAGNOSIS — Z961 Presence of intraocular lens: Secondary | ICD-10-CM | POA: Diagnosis not present

## 2019-05-17 DIAGNOSIS — H5203 Hypermetropia, bilateral: Secondary | ICD-10-CM | POA: Diagnosis not present

## 2019-09-11 DIAGNOSIS — R350 Frequency of micturition: Secondary | ICD-10-CM | POA: Diagnosis not present

## 2019-10-11 DIAGNOSIS — L989 Disorder of the skin and subcutaneous tissue, unspecified: Secondary | ICD-10-CM | POA: Diagnosis not present

## 2019-10-11 DIAGNOSIS — Z0000 Encounter for general adult medical examination without abnormal findings: Secondary | ICD-10-CM | POA: Diagnosis not present

## 2019-10-11 DIAGNOSIS — Z136 Encounter for screening for cardiovascular disorders: Secondary | ICD-10-CM | POA: Diagnosis not present

## 2019-11-16 DIAGNOSIS — D225 Melanocytic nevi of trunk: Secondary | ICD-10-CM | POA: Diagnosis not present

## 2019-11-16 DIAGNOSIS — L820 Inflamed seborrheic keratosis: Secondary | ICD-10-CM | POA: Diagnosis not present

## 2019-11-16 DIAGNOSIS — L821 Other seborrheic keratosis: Secondary | ICD-10-CM | POA: Diagnosis not present

## 2020-01-03 DIAGNOSIS — M25571 Pain in right ankle and joints of right foot: Secondary | ICD-10-CM | POA: Diagnosis not present

## 2020-01-03 DIAGNOSIS — M79671 Pain in right foot: Secondary | ICD-10-CM | POA: Diagnosis not present

## 2020-01-16 DIAGNOSIS — M25561 Pain in right knee: Secondary | ICD-10-CM | POA: Diagnosis not present

## 2020-01-23 DIAGNOSIS — I8311 Varicose veins of right lower extremity with inflammation: Secondary | ICD-10-CM | POA: Diagnosis not present

## 2020-01-23 DIAGNOSIS — I890 Lymphedema, not elsewhere classified: Secondary | ICD-10-CM | POA: Diagnosis not present

## 2020-01-23 DIAGNOSIS — I83891 Varicose veins of right lower extremities with other complications: Secondary | ICD-10-CM | POA: Diagnosis not present

## 2020-04-06 DIAGNOSIS — Z01419 Encounter for gynecological examination (general) (routine) without abnormal findings: Secondary | ICD-10-CM | POA: Diagnosis not present

## 2020-04-06 DIAGNOSIS — Z6830 Body mass index (BMI) 30.0-30.9, adult: Secondary | ICD-10-CM | POA: Diagnosis not present

## 2020-04-06 DIAGNOSIS — Z1231 Encounter for screening mammogram for malignant neoplasm of breast: Secondary | ICD-10-CM | POA: Diagnosis not present

## 2020-04-06 DIAGNOSIS — M8580 Other specified disorders of bone density and structure, unspecified site: Secondary | ICD-10-CM | POA: Diagnosis not present

## 2020-04-28 DIAGNOSIS — N3001 Acute cystitis with hematuria: Secondary | ICD-10-CM | POA: Diagnosis not present

## 2020-04-28 DIAGNOSIS — R300 Dysuria: Secondary | ICD-10-CM | POA: Diagnosis not present

## 2020-05-03 DIAGNOSIS — I890 Lymphedema, not elsewhere classified: Secondary | ICD-10-CM | POA: Diagnosis not present

## 2020-05-03 DIAGNOSIS — I83893 Varicose veins of bilateral lower extremities with other complications: Secondary | ICD-10-CM | POA: Diagnosis not present

## 2020-05-03 DIAGNOSIS — R600 Localized edema: Secondary | ICD-10-CM | POA: Diagnosis not present

## 2020-05-03 DIAGNOSIS — M79604 Pain in right leg: Secondary | ICD-10-CM | POA: Diagnosis not present

## 2020-05-21 DIAGNOSIS — Z87440 Personal history of urinary (tract) infections: Secondary | ICD-10-CM | POA: Diagnosis not present

## 2020-05-25 DIAGNOSIS — N390 Urinary tract infection, site not specified: Secondary | ICD-10-CM | POA: Diagnosis not present

## 2020-05-28 DIAGNOSIS — I8312 Varicose veins of left lower extremity with inflammation: Secondary | ICD-10-CM | POA: Diagnosis not present

## 2020-05-28 DIAGNOSIS — I8311 Varicose veins of right lower extremity with inflammation: Secondary | ICD-10-CM | POA: Diagnosis not present

## 2020-05-28 DIAGNOSIS — I890 Lymphedema, not elsewhere classified: Secondary | ICD-10-CM | POA: Diagnosis not present

## 2020-06-06 DIAGNOSIS — I8311 Varicose veins of right lower extremity with inflammation: Secondary | ICD-10-CM | POA: Diagnosis not present

## 2020-06-06 DIAGNOSIS — I8312 Varicose veins of left lower extremity with inflammation: Secondary | ICD-10-CM | POA: Diagnosis not present

## 2020-06-12 DIAGNOSIS — Z23 Encounter for immunization: Secondary | ICD-10-CM | POA: Diagnosis not present

## 2020-06-27 DIAGNOSIS — M5459 Other low back pain: Secondary | ICD-10-CM | POA: Diagnosis not present

## 2020-09-04 DIAGNOSIS — R2689 Other abnormalities of gait and mobility: Secondary | ICD-10-CM | POA: Diagnosis not present

## 2020-09-04 DIAGNOSIS — M6281 Muscle weakness (generalized): Secondary | ICD-10-CM | POA: Diagnosis not present

## 2020-09-04 DIAGNOSIS — M5136 Other intervertebral disc degeneration, lumbar region: Secondary | ICD-10-CM | POA: Diagnosis not present

## 2020-09-07 DIAGNOSIS — R2689 Other abnormalities of gait and mobility: Secondary | ICD-10-CM | POA: Diagnosis not present

## 2020-09-07 DIAGNOSIS — M6281 Muscle weakness (generalized): Secondary | ICD-10-CM | POA: Diagnosis not present

## 2020-09-07 DIAGNOSIS — M5136 Other intervertebral disc degeneration, lumbar region: Secondary | ICD-10-CM | POA: Diagnosis not present

## 2020-09-11 DIAGNOSIS — Z20822 Contact with and (suspected) exposure to covid-19: Secondary | ICD-10-CM | POA: Diagnosis not present

## 2020-09-18 DIAGNOSIS — H43813 Vitreous degeneration, bilateral: Secondary | ICD-10-CM | POA: Diagnosis not present

## 2020-09-20 DIAGNOSIS — M5136 Other intervertebral disc degeneration, lumbar region: Secondary | ICD-10-CM | POA: Diagnosis not present

## 2020-09-20 DIAGNOSIS — R2689 Other abnormalities of gait and mobility: Secondary | ICD-10-CM | POA: Diagnosis not present

## 2020-09-20 DIAGNOSIS — M6281 Muscle weakness (generalized): Secondary | ICD-10-CM | POA: Diagnosis not present

## 2020-09-25 DIAGNOSIS — R2689 Other abnormalities of gait and mobility: Secondary | ICD-10-CM | POA: Diagnosis not present

## 2020-09-25 DIAGNOSIS — M6281 Muscle weakness (generalized): Secondary | ICD-10-CM | POA: Diagnosis not present

## 2020-09-25 DIAGNOSIS — M5136 Other intervertebral disc degeneration, lumbar region: Secondary | ICD-10-CM | POA: Diagnosis not present

## 2020-09-27 DIAGNOSIS — M5136 Other intervertebral disc degeneration, lumbar region: Secondary | ICD-10-CM | POA: Diagnosis not present

## 2020-09-27 DIAGNOSIS — R2689 Other abnormalities of gait and mobility: Secondary | ICD-10-CM | POA: Diagnosis not present

## 2020-09-27 DIAGNOSIS — M6281 Muscle weakness (generalized): Secondary | ICD-10-CM | POA: Diagnosis not present

## 2020-10-01 DIAGNOSIS — G250 Essential tremor: Secondary | ICD-10-CM | POA: Diagnosis not present

## 2020-10-03 DIAGNOSIS — M6281 Muscle weakness (generalized): Secondary | ICD-10-CM | POA: Diagnosis not present

## 2020-10-03 DIAGNOSIS — M5136 Other intervertebral disc degeneration, lumbar region: Secondary | ICD-10-CM | POA: Diagnosis not present

## 2020-10-03 DIAGNOSIS — R2689 Other abnormalities of gait and mobility: Secondary | ICD-10-CM | POA: Diagnosis not present

## 2020-10-05 DIAGNOSIS — M6281 Muscle weakness (generalized): Secondary | ICD-10-CM | POA: Diagnosis not present

## 2020-10-05 DIAGNOSIS — R2689 Other abnormalities of gait and mobility: Secondary | ICD-10-CM | POA: Diagnosis not present

## 2020-10-05 DIAGNOSIS — M5136 Other intervertebral disc degeneration, lumbar region: Secondary | ICD-10-CM | POA: Diagnosis not present

## 2020-10-08 DIAGNOSIS — M79672 Pain in left foot: Secondary | ICD-10-CM | POA: Diagnosis not present

## 2020-10-08 DIAGNOSIS — M25572 Pain in left ankle and joints of left foot: Secondary | ICD-10-CM | POA: Diagnosis not present

## 2020-10-08 DIAGNOSIS — M25562 Pain in left knee: Secondary | ICD-10-CM | POA: Diagnosis not present

## 2020-10-08 DIAGNOSIS — M5459 Other low back pain: Secondary | ICD-10-CM | POA: Diagnosis not present

## 2020-10-18 DIAGNOSIS — M25552 Pain in left hip: Secondary | ICD-10-CM | POA: Diagnosis not present

## 2020-10-18 DIAGNOSIS — M79672 Pain in left foot: Secondary | ICD-10-CM | POA: Diagnosis not present

## 2020-11-07 DIAGNOSIS — M542 Cervicalgia: Secondary | ICD-10-CM | POA: Diagnosis not present

## 2020-11-07 DIAGNOSIS — Z9181 History of falling: Secondary | ICD-10-CM | POA: Diagnosis not present

## 2020-11-07 DIAGNOSIS — M150 Primary generalized (osteo)arthritis: Secondary | ICD-10-CM | POA: Diagnosis not present

## 2020-11-07 DIAGNOSIS — S92335D Nondisplaced fracture of third metatarsal bone, left foot, subsequent encounter for fracture with routine healing: Secondary | ICD-10-CM | POA: Diagnosis not present

## 2020-11-07 DIAGNOSIS — M545 Low back pain, unspecified: Secondary | ICD-10-CM | POA: Diagnosis not present

## 2020-11-07 DIAGNOSIS — I8390 Asymptomatic varicose veins of unspecified lower extremity: Secondary | ICD-10-CM | POA: Diagnosis not present

## 2020-11-07 DIAGNOSIS — Z87891 Personal history of nicotine dependence: Secondary | ICD-10-CM | POA: Diagnosis not present

## 2020-11-09 DIAGNOSIS — M545 Low back pain, unspecified: Secondary | ICD-10-CM | POA: Diagnosis not present

## 2020-11-09 DIAGNOSIS — M150 Primary generalized (osteo)arthritis: Secondary | ICD-10-CM | POA: Diagnosis not present

## 2020-11-09 DIAGNOSIS — Z87891 Personal history of nicotine dependence: Secondary | ICD-10-CM | POA: Diagnosis not present

## 2020-11-09 DIAGNOSIS — M542 Cervicalgia: Secondary | ICD-10-CM | POA: Diagnosis not present

## 2020-11-09 DIAGNOSIS — I8390 Asymptomatic varicose veins of unspecified lower extremity: Secondary | ICD-10-CM | POA: Diagnosis not present

## 2020-11-09 DIAGNOSIS — S92335D Nondisplaced fracture of third metatarsal bone, left foot, subsequent encounter for fracture with routine healing: Secondary | ICD-10-CM | POA: Diagnosis not present

## 2020-11-12 DIAGNOSIS — I8390 Asymptomatic varicose veins of unspecified lower extremity: Secondary | ICD-10-CM | POA: Diagnosis not present

## 2020-11-12 DIAGNOSIS — M150 Primary generalized (osteo)arthritis: Secondary | ICD-10-CM | POA: Diagnosis not present

## 2020-11-12 DIAGNOSIS — S92335D Nondisplaced fracture of third metatarsal bone, left foot, subsequent encounter for fracture with routine healing: Secondary | ICD-10-CM | POA: Diagnosis not present

## 2020-11-12 DIAGNOSIS — M542 Cervicalgia: Secondary | ICD-10-CM | POA: Diagnosis not present

## 2020-11-12 DIAGNOSIS — Z87891 Personal history of nicotine dependence: Secondary | ICD-10-CM | POA: Diagnosis not present

## 2020-11-12 DIAGNOSIS — M545 Low back pain, unspecified: Secondary | ICD-10-CM | POA: Diagnosis not present

## 2020-11-14 DIAGNOSIS — M542 Cervicalgia: Secondary | ICD-10-CM | POA: Diagnosis not present

## 2020-11-14 DIAGNOSIS — I8390 Asymptomatic varicose veins of unspecified lower extremity: Secondary | ICD-10-CM | POA: Diagnosis not present

## 2020-11-14 DIAGNOSIS — M545 Low back pain, unspecified: Secondary | ICD-10-CM | POA: Diagnosis not present

## 2020-11-14 DIAGNOSIS — Z87891 Personal history of nicotine dependence: Secondary | ICD-10-CM | POA: Diagnosis not present

## 2020-11-14 DIAGNOSIS — M150 Primary generalized (osteo)arthritis: Secondary | ICD-10-CM | POA: Diagnosis not present

## 2020-11-14 DIAGNOSIS — S92335D Nondisplaced fracture of third metatarsal bone, left foot, subsequent encounter for fracture with routine healing: Secondary | ICD-10-CM | POA: Diagnosis not present

## 2020-11-15 DIAGNOSIS — M79672 Pain in left foot: Secondary | ICD-10-CM | POA: Diagnosis not present

## 2020-11-15 DIAGNOSIS — S92334A Nondisplaced fracture of third metatarsal bone, right foot, initial encounter for closed fracture: Secondary | ICD-10-CM | POA: Diagnosis not present

## 2020-11-19 DIAGNOSIS — M542 Cervicalgia: Secondary | ICD-10-CM | POA: Diagnosis not present

## 2020-11-19 DIAGNOSIS — Z87891 Personal history of nicotine dependence: Secondary | ICD-10-CM | POA: Diagnosis not present

## 2020-11-19 DIAGNOSIS — M545 Low back pain, unspecified: Secondary | ICD-10-CM | POA: Diagnosis not present

## 2020-11-19 DIAGNOSIS — S92335D Nondisplaced fracture of third metatarsal bone, left foot, subsequent encounter for fracture with routine healing: Secondary | ICD-10-CM | POA: Diagnosis not present

## 2020-11-19 DIAGNOSIS — I8390 Asymptomatic varicose veins of unspecified lower extremity: Secondary | ICD-10-CM | POA: Diagnosis not present

## 2020-11-19 DIAGNOSIS — M150 Primary generalized (osteo)arthritis: Secondary | ICD-10-CM | POA: Diagnosis not present

## 2020-11-26 DIAGNOSIS — M150 Primary generalized (osteo)arthritis: Secondary | ICD-10-CM | POA: Diagnosis not present

## 2020-11-26 DIAGNOSIS — Z87891 Personal history of nicotine dependence: Secondary | ICD-10-CM | POA: Diagnosis not present

## 2020-11-26 DIAGNOSIS — M545 Low back pain, unspecified: Secondary | ICD-10-CM | POA: Diagnosis not present

## 2020-11-26 DIAGNOSIS — S92335D Nondisplaced fracture of third metatarsal bone, left foot, subsequent encounter for fracture with routine healing: Secondary | ICD-10-CM | POA: Diagnosis not present

## 2020-11-26 DIAGNOSIS — I8390 Asymptomatic varicose veins of unspecified lower extremity: Secondary | ICD-10-CM | POA: Diagnosis not present

## 2020-11-26 DIAGNOSIS — M542 Cervicalgia: Secondary | ICD-10-CM | POA: Diagnosis not present

## 2020-12-04 DIAGNOSIS — I8390 Asymptomatic varicose veins of unspecified lower extremity: Secondary | ICD-10-CM | POA: Diagnosis not present

## 2020-12-04 DIAGNOSIS — M545 Low back pain, unspecified: Secondary | ICD-10-CM | POA: Diagnosis not present

## 2020-12-04 DIAGNOSIS — M150 Primary generalized (osteo)arthritis: Secondary | ICD-10-CM | POA: Diagnosis not present

## 2020-12-04 DIAGNOSIS — S92335D Nondisplaced fracture of third metatarsal bone, left foot, subsequent encounter for fracture with routine healing: Secondary | ICD-10-CM | POA: Diagnosis not present

## 2020-12-04 DIAGNOSIS — Z87891 Personal history of nicotine dependence: Secondary | ICD-10-CM | POA: Diagnosis not present

## 2020-12-04 DIAGNOSIS — M542 Cervicalgia: Secondary | ICD-10-CM | POA: Diagnosis not present

## 2020-12-12 DIAGNOSIS — Z79899 Other long term (current) drug therapy: Secondary | ICD-10-CM | POA: Diagnosis not present

## 2020-12-17 ENCOUNTER — Ambulatory Visit: Payer: Medicare Other | Admitting: Neurology

## 2020-12-20 DIAGNOSIS — S92332A Displaced fracture of third metatarsal bone, left foot, initial encounter for closed fracture: Secondary | ICD-10-CM | POA: Diagnosis not present

## 2020-12-27 DIAGNOSIS — M25659 Stiffness of unspecified hip, not elsewhere classified: Secondary | ICD-10-CM | POA: Diagnosis not present

## 2020-12-27 DIAGNOSIS — M25552 Pain in left hip: Secondary | ICD-10-CM | POA: Diagnosis not present

## 2020-12-27 DIAGNOSIS — M6281 Muscle weakness (generalized): Secondary | ICD-10-CM | POA: Diagnosis not present

## 2020-12-27 DIAGNOSIS — M25551 Pain in right hip: Secondary | ICD-10-CM | POA: Diagnosis not present

## 2021-01-08 DIAGNOSIS — M25552 Pain in left hip: Secondary | ICD-10-CM | POA: Diagnosis not present

## 2021-01-08 DIAGNOSIS — M25551 Pain in right hip: Secondary | ICD-10-CM | POA: Diagnosis not present

## 2021-01-08 DIAGNOSIS — M25659 Stiffness of unspecified hip, not elsewhere classified: Secondary | ICD-10-CM | POA: Diagnosis not present

## 2021-01-08 DIAGNOSIS — M6281 Muscle weakness (generalized): Secondary | ICD-10-CM | POA: Diagnosis not present

## 2021-01-10 DIAGNOSIS — M6281 Muscle weakness (generalized): Secondary | ICD-10-CM | POA: Diagnosis not present

## 2021-01-10 DIAGNOSIS — M25552 Pain in left hip: Secondary | ICD-10-CM | POA: Diagnosis not present

## 2021-01-10 DIAGNOSIS — M25551 Pain in right hip: Secondary | ICD-10-CM | POA: Diagnosis not present

## 2021-01-10 DIAGNOSIS — M25659 Stiffness of unspecified hip, not elsewhere classified: Secondary | ICD-10-CM | POA: Diagnosis not present

## 2021-01-15 DIAGNOSIS — M25552 Pain in left hip: Secondary | ICD-10-CM | POA: Diagnosis not present

## 2021-01-15 DIAGNOSIS — M25659 Stiffness of unspecified hip, not elsewhere classified: Secondary | ICD-10-CM | POA: Diagnosis not present

## 2021-01-15 DIAGNOSIS — M6281 Muscle weakness (generalized): Secondary | ICD-10-CM | POA: Diagnosis not present

## 2021-01-15 DIAGNOSIS — M25551 Pain in right hip: Secondary | ICD-10-CM | POA: Diagnosis not present

## 2021-01-17 DIAGNOSIS — M25659 Stiffness of unspecified hip, not elsewhere classified: Secondary | ICD-10-CM | POA: Diagnosis not present

## 2021-01-17 DIAGNOSIS — M25552 Pain in left hip: Secondary | ICD-10-CM | POA: Diagnosis not present

## 2021-01-17 DIAGNOSIS — M6281 Muscle weakness (generalized): Secondary | ICD-10-CM | POA: Diagnosis not present

## 2021-01-17 DIAGNOSIS — M25551 Pain in right hip: Secondary | ICD-10-CM | POA: Diagnosis not present

## 2021-01-22 DIAGNOSIS — M25659 Stiffness of unspecified hip, not elsewhere classified: Secondary | ICD-10-CM | POA: Diagnosis not present

## 2021-01-22 DIAGNOSIS — M25551 Pain in right hip: Secondary | ICD-10-CM | POA: Diagnosis not present

## 2021-01-22 DIAGNOSIS — M6281 Muscle weakness (generalized): Secondary | ICD-10-CM | POA: Diagnosis not present

## 2021-01-22 DIAGNOSIS — M25552 Pain in left hip: Secondary | ICD-10-CM | POA: Diagnosis not present

## 2021-01-24 DIAGNOSIS — M25551 Pain in right hip: Secondary | ICD-10-CM | POA: Diagnosis not present

## 2021-01-24 DIAGNOSIS — M25659 Stiffness of unspecified hip, not elsewhere classified: Secondary | ICD-10-CM | POA: Diagnosis not present

## 2021-01-24 DIAGNOSIS — M25552 Pain in left hip: Secondary | ICD-10-CM | POA: Diagnosis not present

## 2021-01-24 DIAGNOSIS — M6281 Muscle weakness (generalized): Secondary | ICD-10-CM | POA: Diagnosis not present

## 2021-01-29 DIAGNOSIS — M25552 Pain in left hip: Secondary | ICD-10-CM | POA: Diagnosis not present

## 2021-01-29 DIAGNOSIS — M6281 Muscle weakness (generalized): Secondary | ICD-10-CM | POA: Diagnosis not present

## 2021-01-29 DIAGNOSIS — M25551 Pain in right hip: Secondary | ICD-10-CM | POA: Diagnosis not present

## 2021-01-29 DIAGNOSIS — M25659 Stiffness of unspecified hip, not elsewhere classified: Secondary | ICD-10-CM | POA: Diagnosis not present

## 2021-01-31 DIAGNOSIS — M25551 Pain in right hip: Secondary | ICD-10-CM | POA: Diagnosis not present

## 2021-01-31 DIAGNOSIS — M6281 Muscle weakness (generalized): Secondary | ICD-10-CM | POA: Diagnosis not present

## 2021-01-31 DIAGNOSIS — M25552 Pain in left hip: Secondary | ICD-10-CM | POA: Diagnosis not present

## 2021-01-31 DIAGNOSIS — M25659 Stiffness of unspecified hip, not elsewhere classified: Secondary | ICD-10-CM | POA: Diagnosis not present

## 2021-02-05 DIAGNOSIS — M25659 Stiffness of unspecified hip, not elsewhere classified: Secondary | ICD-10-CM | POA: Diagnosis not present

## 2021-02-05 DIAGNOSIS — M25551 Pain in right hip: Secondary | ICD-10-CM | POA: Diagnosis not present

## 2021-02-05 DIAGNOSIS — M6281 Muscle weakness (generalized): Secondary | ICD-10-CM | POA: Diagnosis not present

## 2021-02-05 DIAGNOSIS — M25552 Pain in left hip: Secondary | ICD-10-CM | POA: Diagnosis not present

## 2021-02-07 DIAGNOSIS — M6281 Muscle weakness (generalized): Secondary | ICD-10-CM | POA: Diagnosis not present

## 2021-02-07 DIAGNOSIS — M25551 Pain in right hip: Secondary | ICD-10-CM | POA: Diagnosis not present

## 2021-02-07 DIAGNOSIS — M25552 Pain in left hip: Secondary | ICD-10-CM | POA: Diagnosis not present

## 2021-02-07 DIAGNOSIS — M25659 Stiffness of unspecified hip, not elsewhere classified: Secondary | ICD-10-CM | POA: Diagnosis not present

## 2021-02-13 DIAGNOSIS — G250 Essential tremor: Secondary | ICD-10-CM | POA: Diagnosis not present

## 2021-03-22 DIAGNOSIS — G250 Essential tremor: Secondary | ICD-10-CM | POA: Diagnosis not present

## 2021-03-25 DIAGNOSIS — M25561 Pain in right knee: Secondary | ICD-10-CM | POA: Diagnosis not present

## 2021-04-04 ENCOUNTER — Ambulatory Visit: Payer: Medicare Other | Admitting: Neurology

## 2021-04-15 DIAGNOSIS — L601 Onycholysis: Secondary | ICD-10-CM | POA: Diagnosis not present

## 2021-04-15 DIAGNOSIS — L820 Inflamed seborrheic keratosis: Secondary | ICD-10-CM | POA: Diagnosis not present

## 2021-04-17 DIAGNOSIS — Z01419 Encounter for gynecological examination (general) (routine) without abnormal findings: Secondary | ICD-10-CM | POA: Diagnosis not present

## 2021-04-17 DIAGNOSIS — Z6831 Body mass index (BMI) 31.0-31.9, adult: Secondary | ICD-10-CM | POA: Diagnosis not present

## 2021-04-17 DIAGNOSIS — Z1231 Encounter for screening mammogram for malignant neoplasm of breast: Secondary | ICD-10-CM | POA: Diagnosis not present

## 2021-05-08 DIAGNOSIS — Z79899 Other long term (current) drug therapy: Secondary | ICD-10-CM | POA: Diagnosis not present

## 2021-05-15 DIAGNOSIS — R799 Abnormal finding of blood chemistry, unspecified: Secondary | ICD-10-CM | POA: Diagnosis not present

## 2021-05-22 DIAGNOSIS — R7309 Other abnormal glucose: Secondary | ICD-10-CM | POA: Diagnosis not present

## 2021-08-07 DIAGNOSIS — J0110 Acute frontal sinusitis, unspecified: Secondary | ICD-10-CM | POA: Diagnosis not present

## 2021-08-07 DIAGNOSIS — J029 Acute pharyngitis, unspecified: Secondary | ICD-10-CM | POA: Diagnosis not present

## 2021-08-22 DIAGNOSIS — R519 Headache, unspecified: Secondary | ICD-10-CM | POA: Diagnosis not present

## 2021-08-22 DIAGNOSIS — M542 Cervicalgia: Secondary | ICD-10-CM | POA: Diagnosis not present

## 2021-08-31 DIAGNOSIS — J069 Acute upper respiratory infection, unspecified: Secondary | ICD-10-CM | POA: Diagnosis not present

## 2021-09-18 DIAGNOSIS — G250 Essential tremor: Secondary | ICD-10-CM | POA: Diagnosis not present

## 2021-09-18 DIAGNOSIS — R7303 Prediabetes: Secondary | ICD-10-CM | POA: Diagnosis not present

## 2021-10-10 DIAGNOSIS — Z6829 Body mass index (BMI) 29.0-29.9, adult: Secondary | ICD-10-CM | POA: Diagnosis not present

## 2021-10-10 DIAGNOSIS — E663 Overweight: Secondary | ICD-10-CM | POA: Diagnosis not present

## 2021-11-05 DIAGNOSIS — G250 Essential tremor: Secondary | ICD-10-CM | POA: Diagnosis not present

## 2021-11-05 DIAGNOSIS — R7303 Prediabetes: Secondary | ICD-10-CM | POA: Diagnosis not present

## 2021-11-05 DIAGNOSIS — Z0000 Encounter for general adult medical examination without abnormal findings: Secondary | ICD-10-CM | POA: Diagnosis not present

## 2021-11-05 DIAGNOSIS — Z131 Encounter for screening for diabetes mellitus: Secondary | ICD-10-CM | POA: Diagnosis not present

## 2021-11-05 DIAGNOSIS — Z136 Encounter for screening for cardiovascular disorders: Secondary | ICD-10-CM | POA: Diagnosis not present

## 2021-11-12 DIAGNOSIS — H43813 Vitreous degeneration, bilateral: Secondary | ICD-10-CM | POA: Diagnosis not present

## 2021-12-06 DIAGNOSIS — M25561 Pain in right knee: Secondary | ICD-10-CM | POA: Diagnosis not present

## 2022-01-15 DIAGNOSIS — R7303 Prediabetes: Secondary | ICD-10-CM | POA: Diagnosis not present

## 2022-04-29 DIAGNOSIS — Z6827 Body mass index (BMI) 27.0-27.9, adult: Secondary | ICD-10-CM | POA: Diagnosis not present

## 2022-04-29 DIAGNOSIS — M13852 Other specified arthritis, left hip: Secondary | ICD-10-CM | POA: Diagnosis not present

## 2022-04-29 DIAGNOSIS — N958 Other specified menopausal and perimenopausal disorders: Secondary | ICD-10-CM | POA: Diagnosis not present

## 2022-04-29 DIAGNOSIS — Z01419 Encounter for gynecological examination (general) (routine) without abnormal findings: Secondary | ICD-10-CM | POA: Diagnosis not present

## 2022-04-29 DIAGNOSIS — Z1231 Encounter for screening mammogram for malignant neoplasm of breast: Secondary | ICD-10-CM | POA: Diagnosis not present

## 2022-04-29 DIAGNOSIS — M8588 Other specified disorders of bone density and structure, other site: Secondary | ICD-10-CM | POA: Diagnosis not present

## 2022-04-29 DIAGNOSIS — Z8262 Family history of osteoporosis: Secondary | ICD-10-CM | POA: Diagnosis not present

## 2022-06-20 DIAGNOSIS — M25561 Pain in right knee: Secondary | ICD-10-CM | POA: Diagnosis not present

## 2022-11-18 DIAGNOSIS — R7303 Prediabetes: Secondary | ICD-10-CM | POA: Diagnosis not present

## 2022-11-18 DIAGNOSIS — Z13220 Encounter for screening for lipoid disorders: Secondary | ICD-10-CM | POA: Diagnosis not present

## 2022-11-18 DIAGNOSIS — G250 Essential tremor: Secondary | ICD-10-CM | POA: Diagnosis not present

## 2022-11-18 DIAGNOSIS — Z136 Encounter for screening for cardiovascular disorders: Secondary | ICD-10-CM | POA: Diagnosis not present

## 2022-11-18 DIAGNOSIS — Z0000 Encounter for general adult medical examination without abnormal findings: Secondary | ICD-10-CM | POA: Diagnosis not present

## 2022-11-18 DIAGNOSIS — Z23 Encounter for immunization: Secondary | ICD-10-CM | POA: Diagnosis not present

## 2022-11-21 DIAGNOSIS — M1711 Unilateral primary osteoarthritis, right knee: Secondary | ICD-10-CM | POA: Diagnosis not present

## 2022-11-26 DIAGNOSIS — N3945 Continuous leakage: Secondary | ICD-10-CM | POA: Diagnosis not present

## 2023-01-02 DIAGNOSIS — M1711 Unilateral primary osteoarthritis, right knee: Secondary | ICD-10-CM | POA: Diagnosis not present

## 2023-01-09 DIAGNOSIS — M1711 Unilateral primary osteoarthritis, right knee: Secondary | ICD-10-CM | POA: Diagnosis not present

## 2023-01-16 DIAGNOSIS — R350 Frequency of micturition: Secondary | ICD-10-CM | POA: Diagnosis not present

## 2023-01-16 DIAGNOSIS — N3946 Mixed incontinence: Secondary | ICD-10-CM | POA: Diagnosis not present

## 2023-02-18 DIAGNOSIS — H43813 Vitreous degeneration, bilateral: Secondary | ICD-10-CM | POA: Diagnosis not present

## 2023-02-20 DIAGNOSIS — M25561 Pain in right knee: Secondary | ICD-10-CM | POA: Diagnosis not present

## 2023-03-11 DIAGNOSIS — R350 Frequency of micturition: Secondary | ICD-10-CM | POA: Diagnosis not present

## 2023-03-11 DIAGNOSIS — N3946 Mixed incontinence: Secondary | ICD-10-CM | POA: Diagnosis not present

## 2023-03-25 DIAGNOSIS — H01006 Unspecified blepharitis left eye, unspecified eyelid: Secondary | ICD-10-CM | POA: Diagnosis not present

## 2023-03-26 DIAGNOSIS — H0014 Chalazion left upper eyelid: Secondary | ICD-10-CM | POA: Diagnosis not present

## 2023-05-02 DIAGNOSIS — R051 Acute cough: Secondary | ICD-10-CM | POA: Diagnosis not present

## 2023-05-02 DIAGNOSIS — U071 COVID-19: Secondary | ICD-10-CM | POA: Diagnosis not present

## 2023-05-12 DIAGNOSIS — L820 Inflamed seborrheic keratosis: Secondary | ICD-10-CM | POA: Diagnosis not present

## 2023-05-12 DIAGNOSIS — L821 Other seborrheic keratosis: Secondary | ICD-10-CM | POA: Diagnosis not present

## 2023-05-12 DIAGNOSIS — D2271 Melanocytic nevi of right lower limb, including hip: Secondary | ICD-10-CM | POA: Diagnosis not present

## 2023-05-12 DIAGNOSIS — L603 Nail dystrophy: Secondary | ICD-10-CM | POA: Diagnosis not present

## 2023-05-12 DIAGNOSIS — D225 Melanocytic nevi of trunk: Secondary | ICD-10-CM | POA: Diagnosis not present

## 2023-05-12 DIAGNOSIS — D1721 Benign lipomatous neoplasm of skin and subcutaneous tissue of right arm: Secondary | ICD-10-CM | POA: Diagnosis not present

## 2023-05-12 DIAGNOSIS — D2262 Melanocytic nevi of left upper limb, including shoulder: Secondary | ICD-10-CM | POA: Diagnosis not present

## 2023-07-21 DIAGNOSIS — Z01419 Encounter for gynecological examination (general) (routine) without abnormal findings: Secondary | ICD-10-CM | POA: Diagnosis not present

## 2023-07-21 DIAGNOSIS — N959 Unspecified menopausal and perimenopausal disorder: Secondary | ICD-10-CM | POA: Diagnosis not present

## 2023-07-21 DIAGNOSIS — Z6828 Body mass index (BMI) 28.0-28.9, adult: Secondary | ICD-10-CM | POA: Diagnosis not present

## 2023-07-21 DIAGNOSIS — Z1231 Encounter for screening mammogram for malignant neoplasm of breast: Secondary | ICD-10-CM | POA: Diagnosis not present

## 2023-08-07 DIAGNOSIS — N3946 Mixed incontinence: Secondary | ICD-10-CM | POA: Diagnosis not present

## 2023-08-07 DIAGNOSIS — R350 Frequency of micturition: Secondary | ICD-10-CM | POA: Diagnosis not present

## 2023-09-01 DIAGNOSIS — M1711 Unilateral primary osteoarthritis, right knee: Secondary | ICD-10-CM | POA: Diagnosis not present

## 2023-09-08 DIAGNOSIS — N3946 Mixed incontinence: Secondary | ICD-10-CM | POA: Diagnosis not present

## 2023-09-08 DIAGNOSIS — R350 Frequency of micturition: Secondary | ICD-10-CM | POA: Diagnosis not present

## 2023-09-15 DIAGNOSIS — R350 Frequency of micturition: Secondary | ICD-10-CM | POA: Diagnosis not present

## 2023-09-15 DIAGNOSIS — N3946 Mixed incontinence: Secondary | ICD-10-CM | POA: Diagnosis not present

## 2023-09-22 DIAGNOSIS — N3946 Mixed incontinence: Secondary | ICD-10-CM | POA: Diagnosis not present

## 2023-09-22 DIAGNOSIS — R350 Frequency of micturition: Secondary | ICD-10-CM | POA: Diagnosis not present

## 2023-09-29 DIAGNOSIS — R350 Frequency of micturition: Secondary | ICD-10-CM | POA: Diagnosis not present

## 2023-09-29 DIAGNOSIS — N3946 Mixed incontinence: Secondary | ICD-10-CM | POA: Diagnosis not present

## 2023-10-06 DIAGNOSIS — R350 Frequency of micturition: Secondary | ICD-10-CM | POA: Diagnosis not present

## 2023-10-06 DIAGNOSIS — N3946 Mixed incontinence: Secondary | ICD-10-CM | POA: Diagnosis not present

## 2023-10-13 DIAGNOSIS — R350 Frequency of micturition: Secondary | ICD-10-CM | POA: Diagnosis not present

## 2023-10-13 DIAGNOSIS — N3946 Mixed incontinence: Secondary | ICD-10-CM | POA: Diagnosis not present

## 2023-10-20 DIAGNOSIS — R350 Frequency of micturition: Secondary | ICD-10-CM | POA: Diagnosis not present

## 2023-10-20 DIAGNOSIS — N3946 Mixed incontinence: Secondary | ICD-10-CM | POA: Diagnosis not present

## 2023-10-27 DIAGNOSIS — R350 Frequency of micturition: Secondary | ICD-10-CM | POA: Diagnosis not present

## 2023-10-27 DIAGNOSIS — N3946 Mixed incontinence: Secondary | ICD-10-CM | POA: Diagnosis not present

## 2023-11-03 DIAGNOSIS — R350 Frequency of micturition: Secondary | ICD-10-CM | POA: Diagnosis not present

## 2023-11-03 DIAGNOSIS — N3946 Mixed incontinence: Secondary | ICD-10-CM | POA: Diagnosis not present

## 2023-11-12 DIAGNOSIS — R350 Frequency of micturition: Secondary | ICD-10-CM | POA: Diagnosis not present

## 2023-11-12 DIAGNOSIS — N3946 Mixed incontinence: Secondary | ICD-10-CM | POA: Diagnosis not present

## 2023-11-17 DIAGNOSIS — R350 Frequency of micturition: Secondary | ICD-10-CM | POA: Diagnosis not present

## 2023-11-17 DIAGNOSIS — N3946 Mixed incontinence: Secondary | ICD-10-CM | POA: Diagnosis not present

## 2023-11-24 DIAGNOSIS — N3946 Mixed incontinence: Secondary | ICD-10-CM | POA: Diagnosis not present

## 2023-12-01 DIAGNOSIS — G250 Essential tremor: Secondary | ICD-10-CM | POA: Diagnosis not present

## 2023-12-01 DIAGNOSIS — Z0000 Encounter for general adult medical examination without abnormal findings: Secondary | ICD-10-CM | POA: Diagnosis not present

## 2023-12-01 DIAGNOSIS — Z136 Encounter for screening for cardiovascular disorders: Secondary | ICD-10-CM | POA: Diagnosis not present

## 2023-12-01 DIAGNOSIS — Z13220 Encounter for screening for lipoid disorders: Secondary | ICD-10-CM | POA: Diagnosis not present

## 2023-12-01 DIAGNOSIS — R7303 Prediabetes: Secondary | ICD-10-CM | POA: Diagnosis not present

## 2023-12-29 DIAGNOSIS — R350 Frequency of micturition: Secondary | ICD-10-CM | POA: Diagnosis not present

## 2024-01-26 DIAGNOSIS — R350 Frequency of micturition: Secondary | ICD-10-CM | POA: Diagnosis not present

## 2024-02-23 DIAGNOSIS — R350 Frequency of micturition: Secondary | ICD-10-CM | POA: Diagnosis not present

## 2024-03-22 DIAGNOSIS — R350 Frequency of micturition: Secondary | ICD-10-CM | POA: Diagnosis not present

## 2024-04-11 DIAGNOSIS — M1711 Unilateral primary osteoarthritis, right knee: Secondary | ICD-10-CM | POA: Diagnosis not present

## 2024-04-19 DIAGNOSIS — R350 Frequency of micturition: Secondary | ICD-10-CM | POA: Diagnosis not present

## 2024-04-26 DIAGNOSIS — M79604 Pain in right leg: Secondary | ICD-10-CM | POA: Diagnosis not present

## 2024-04-26 DIAGNOSIS — I87391 Chronic venous hypertension (idiopathic) with other complications of right lower extremity: Secondary | ICD-10-CM | POA: Diagnosis not present

## 2024-04-26 DIAGNOSIS — I890 Lymphedema, not elsewhere classified: Secondary | ICD-10-CM | POA: Diagnosis not present

## 2024-04-26 DIAGNOSIS — I872 Venous insufficiency (chronic) (peripheral): Secondary | ICD-10-CM | POA: Diagnosis not present

## 2024-04-26 DIAGNOSIS — M79661 Pain in right lower leg: Secondary | ICD-10-CM | POA: Diagnosis not present

## 2024-05-06 DIAGNOSIS — N3946 Mixed incontinence: Secondary | ICD-10-CM | POA: Diagnosis not present

## 2024-05-06 DIAGNOSIS — R350 Frequency of micturition: Secondary | ICD-10-CM | POA: Diagnosis not present

## 2024-05-17 DIAGNOSIS — R350 Frequency of micturition: Secondary | ICD-10-CM | POA: Diagnosis not present

## 2024-06-14 DIAGNOSIS — R350 Frequency of micturition: Secondary | ICD-10-CM | POA: Diagnosis not present

## 2024-07-11 DIAGNOSIS — N3946 Mixed incontinence: Secondary | ICD-10-CM | POA: Diagnosis not present

## 2024-07-12 DIAGNOSIS — R350 Frequency of micturition: Secondary | ICD-10-CM | POA: Diagnosis not present

## 2024-07-21 DIAGNOSIS — Z1231 Encounter for screening mammogram for malignant neoplasm of breast: Secondary | ICD-10-CM | POA: Diagnosis not present

## 2024-07-21 DIAGNOSIS — N951 Menopausal and female climacteric states: Secondary | ICD-10-CM | POA: Diagnosis not present

## 2024-07-21 DIAGNOSIS — Z01419 Encounter for gynecological examination (general) (routine) without abnormal findings: Secondary | ICD-10-CM | POA: Diagnosis not present

## 2024-07-21 DIAGNOSIS — Z6829 Body mass index (BMI) 29.0-29.9, adult: Secondary | ICD-10-CM | POA: Diagnosis not present

## 2024-07-24 DIAGNOSIS — R051 Acute cough: Secondary | ICD-10-CM | POA: Diagnosis not present

## 2024-07-24 DIAGNOSIS — B349 Viral infection, unspecified: Secondary | ICD-10-CM | POA: Diagnosis not present

## 2024-07-24 DIAGNOSIS — J029 Acute pharyngitis, unspecified: Secondary | ICD-10-CM | POA: Diagnosis not present

## 2024-07-24 DIAGNOSIS — R110 Nausea: Secondary | ICD-10-CM | POA: Diagnosis not present

## 2024-08-01 DIAGNOSIS — M5459 Other low back pain: Secondary | ICD-10-CM | POA: Diagnosis not present

## 2024-08-04 DIAGNOSIS — M47816 Spondylosis without myelopathy or radiculopathy, lumbar region: Secondary | ICD-10-CM | POA: Diagnosis not present

## 2024-08-04 DIAGNOSIS — M545 Low back pain, unspecified: Secondary | ICD-10-CM | POA: Diagnosis not present
